# Patient Record
Sex: Male | Born: 1935 | Race: Black or African American | Hispanic: No | Marital: Married | Smoking: Former smoker
Health system: Southern US, Community
[De-identification: ages and names within clinical notes are randomized; demographics above are authoritative.]

## PROBLEM LIST (undated history)

## (undated) DIAGNOSIS — C801 Malignant (primary) neoplasm, unspecified: Secondary | ICD-10-CM

## (undated) DIAGNOSIS — N183 Chronic kidney disease, stage 3 unspecified: Secondary | ICD-10-CM

## (undated) DIAGNOSIS — E785 Hyperlipidemia, unspecified: Secondary | ICD-10-CM

## (undated) DIAGNOSIS — E559 Vitamin D deficiency, unspecified: Secondary | ICD-10-CM

## (undated) DIAGNOSIS — I639 Cerebral infarction, unspecified: Secondary | ICD-10-CM

## (undated) DIAGNOSIS — I10 Essential (primary) hypertension: Secondary | ICD-10-CM

## (undated) DIAGNOSIS — H547 Unspecified visual loss: Secondary | ICD-10-CM

## (undated) HISTORY — DX: Chronic kidney disease, stage 3 (moderate): N18.3

## (undated) HISTORY — DX: Hyperlipidemia, unspecified: E78.5

## (undated) HISTORY — DX: Cerebral infarction, unspecified: I63.9

## (undated) HISTORY — DX: Essential (primary) hypertension: I10

## (undated) HISTORY — DX: Vitamin D deficiency, unspecified: E55.9

## (undated) HISTORY — DX: Chronic kidney disease, stage 3 unspecified: N18.30

## (undated) HISTORY — DX: Malignant (primary) neoplasm, unspecified: C80.1

## (undated) HISTORY — DX: Unspecified visual loss: H54.7

---

## 2007-10-08 ENCOUNTER — Ambulatory Visit (HOSPITAL_COMMUNITY): Admission: RE | Admit: 2007-10-08 | Discharge: 2007-10-08 | Payer: Self-pay | Admitting: Urology

## 2007-10-08 IMAGING — CR DG CHEST 2V
2 series · 2 of 2 positions shown · non-contrast
Comparison: Bone Scan, [DATE]

CLINICAL DATA: Prostate cancer

CHEST - 2 VIEW

[w chest pa]
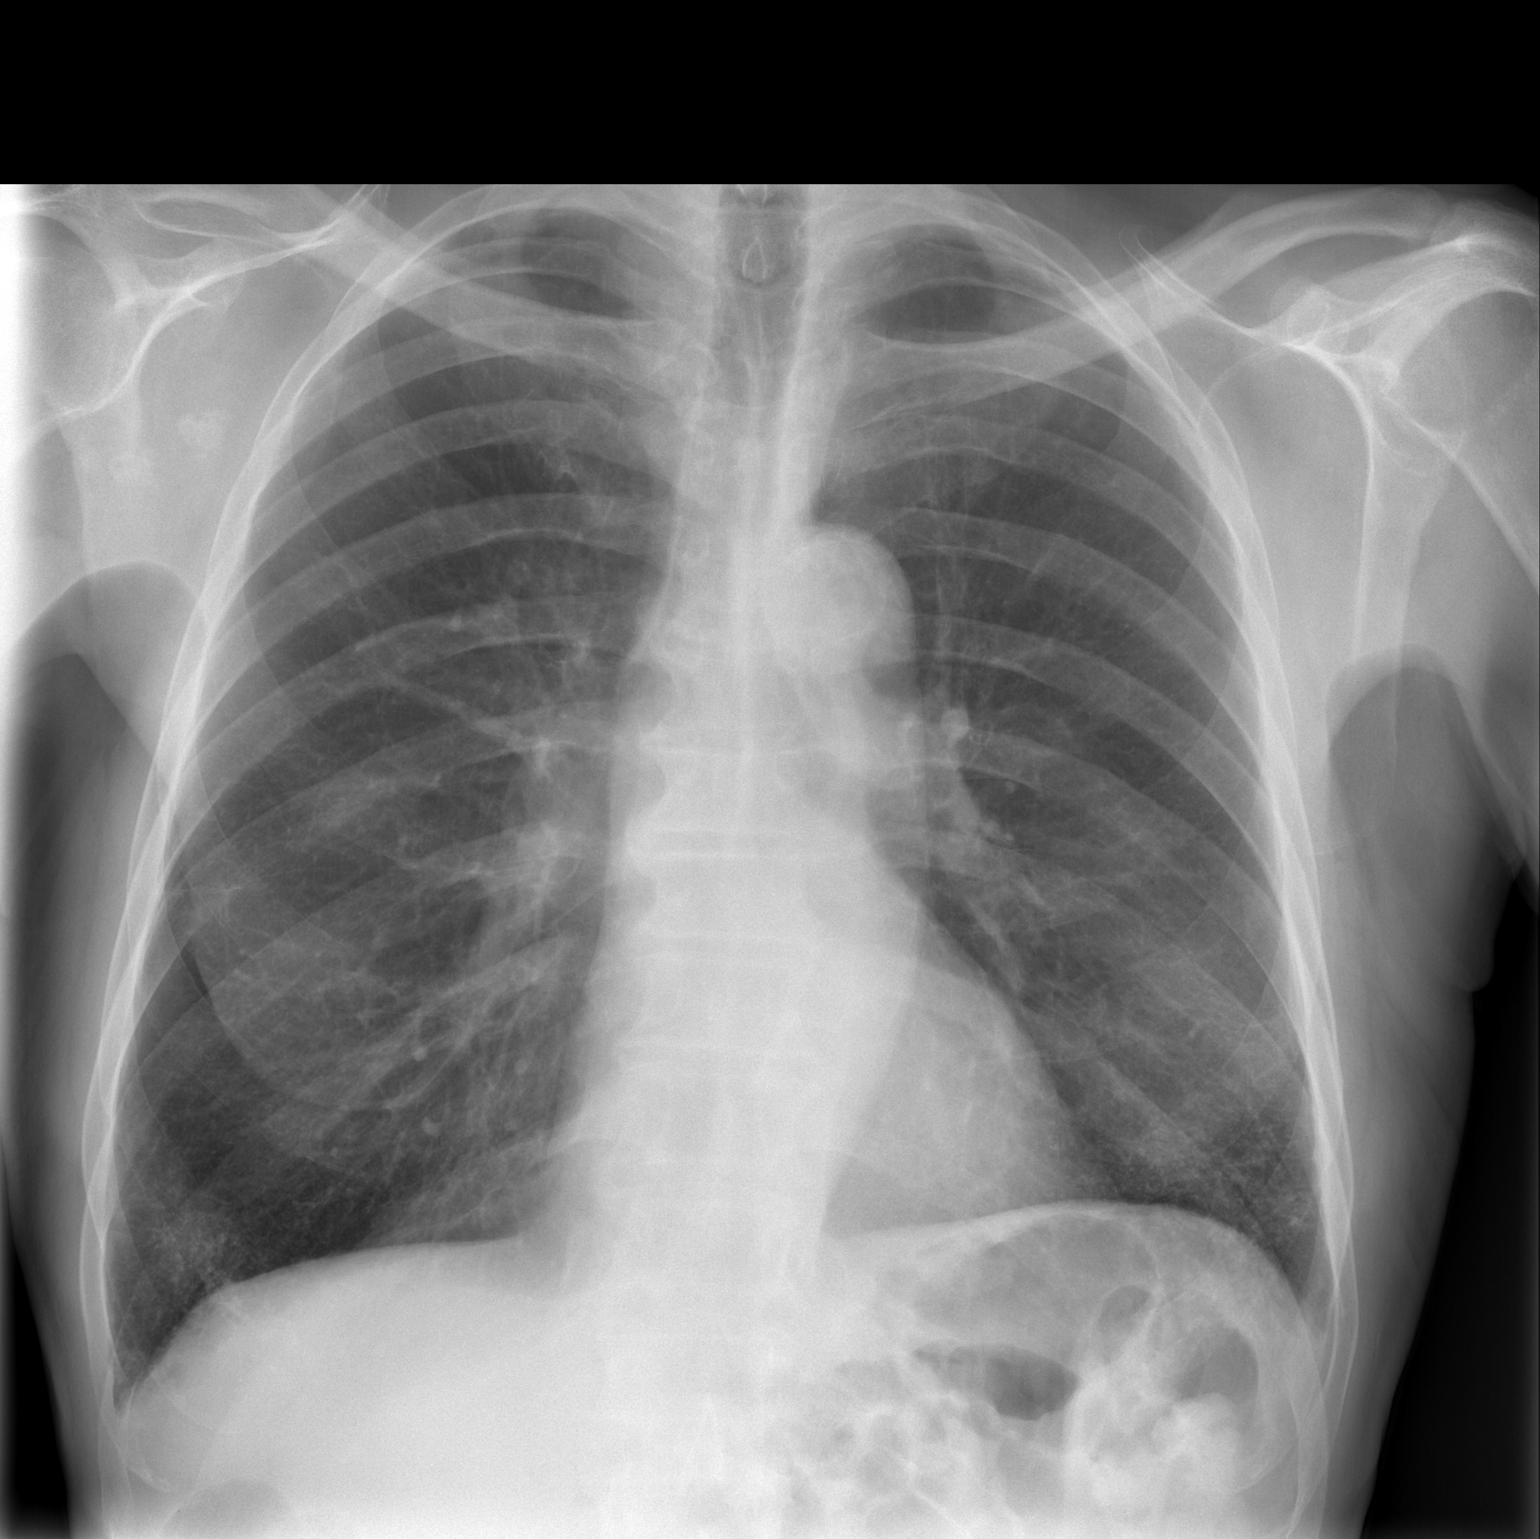

[w chest lat]
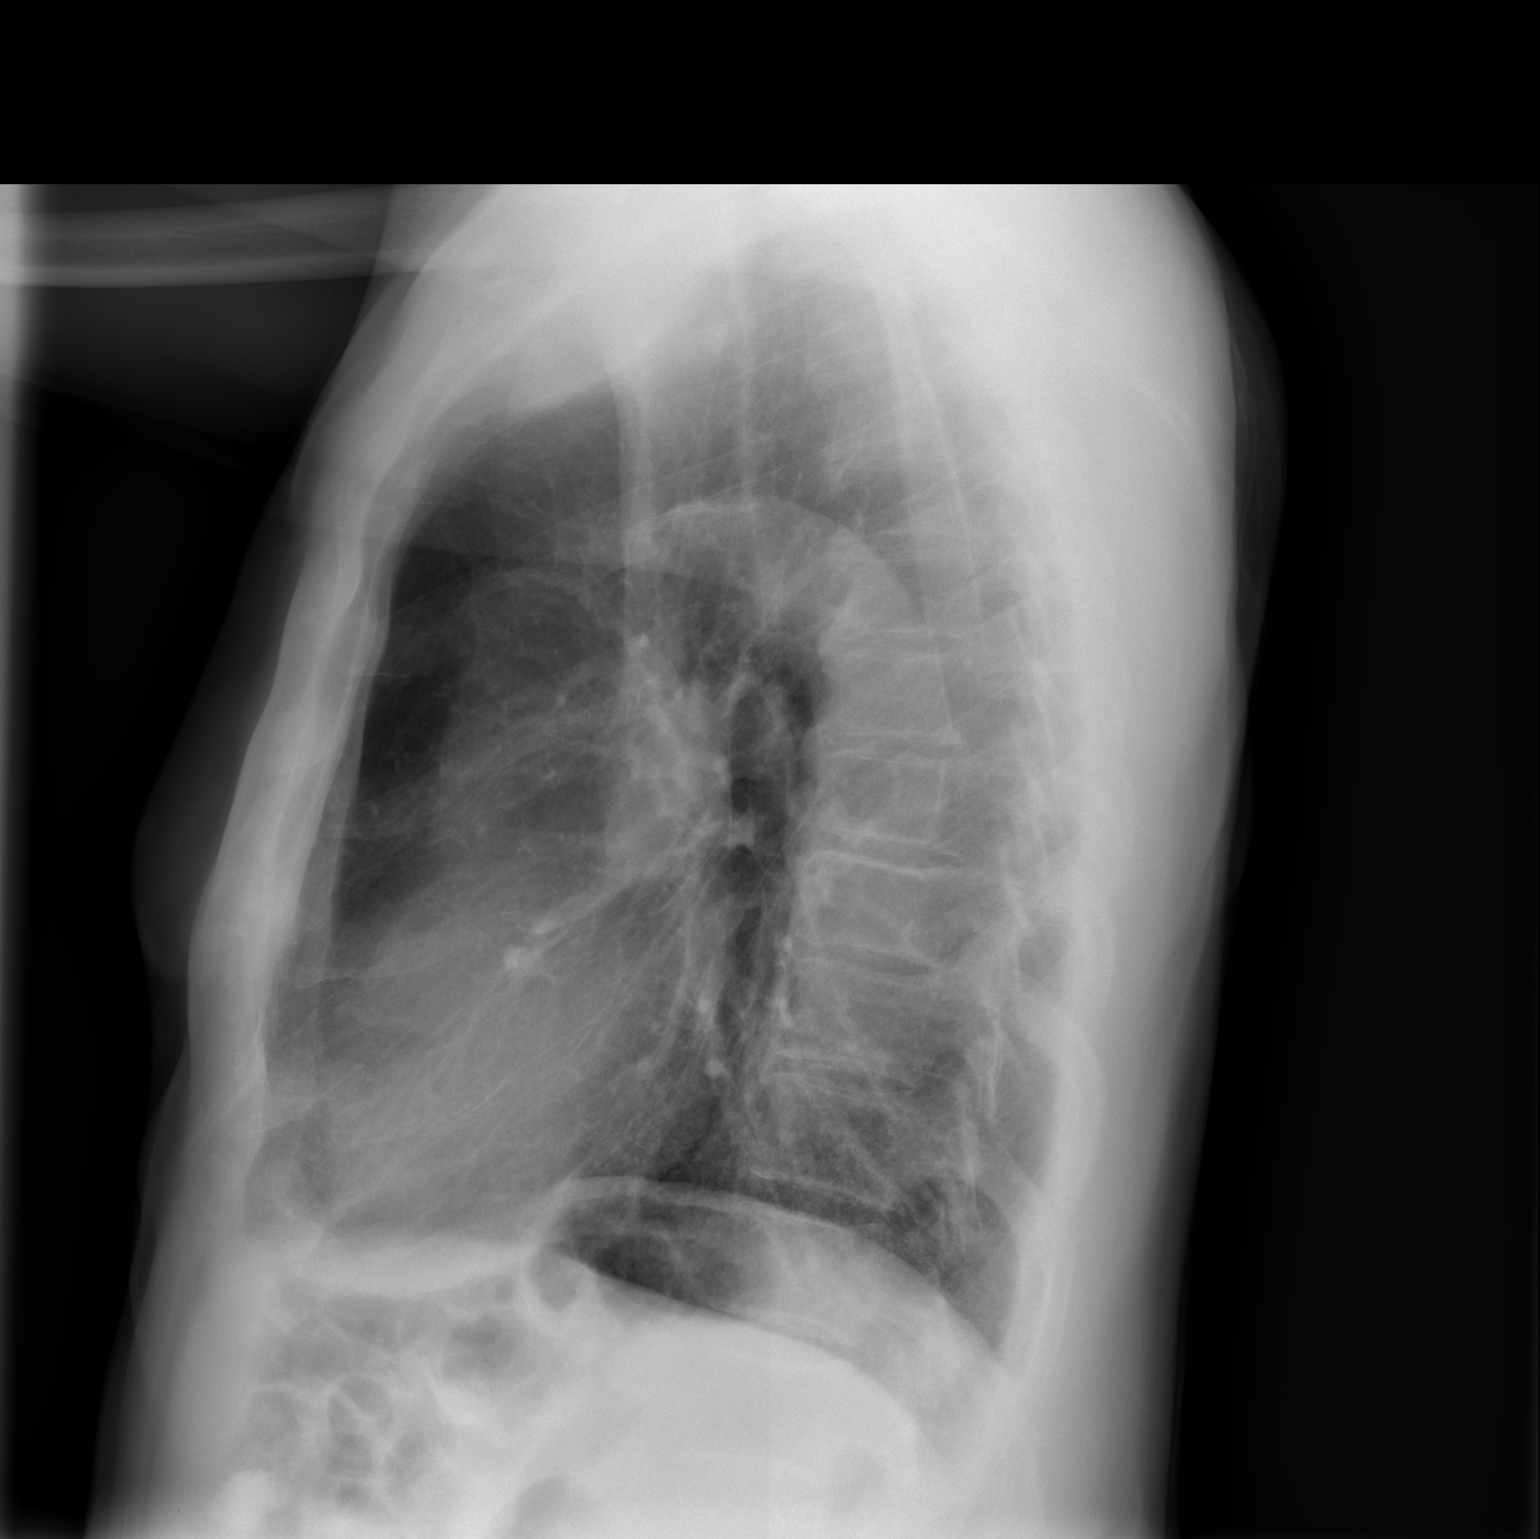

[2 of 2 positions shown; findings below may reference images not displayed]

FINDINGS: Normal mediastinum and cardiac silhouette.  Costophrenic
angles are clear.

No clear lytic or sclerotic lesion identified in the right
posterior fourth through to  correlate with bone scan findings.
There are two foci of sclerosis versus calcific density projecting
over the right scapula.
IMPRESSION: 1..  No and clear evidence of metastatic prostate cancer.
2..  No acute cardiopulmonary process.
3..  Sclerotic foci over the right scapula could represent a
calcified lymph nodes versus calcific tendonitis, versus chondroid
lesion in the scapula.

Recommend CT thorax.

## 2007-11-21 ENCOUNTER — Ambulatory Visit (HOSPITAL_COMMUNITY): Payer: Self-pay | Admitting: Oncology

## 2007-11-21 ENCOUNTER — Encounter (HOSPITAL_COMMUNITY): Admission: RE | Admit: 2007-11-21 | Discharge: 2007-12-21 | Payer: Self-pay | Admitting: Oncology

## 2010-05-22 ENCOUNTER — Encounter: Payer: Self-pay | Admitting: Urology

## 2011-01-27 LAB — DIFFERENTIAL
Eosinophils Absolute: 0.1
Eosinophils Relative: 1
Lymphs Abs: 2
Monocytes Absolute: 0.6
Monocytes Relative: 6
Neutrophils Relative %: 76

## 2011-01-27 LAB — COMPREHENSIVE METABOLIC PANEL
Albumin: 4
BUN: 8
Chloride: 102
GFR calc non Af Amer: >60
Glucose, Bld: 69 — ABNORMAL LOW
Potassium: 4.4
Total Bilirubin: 1.3 — ABNORMAL HIGH
Total Protein: 7.5

## 2011-01-27 LAB — CBC
Platelets: 376
RDW: 13.8

## 2012-06-01 HISTORY — PX: FRACTURE SURGERY: SHX138

## 2012-07-22 ENCOUNTER — Other Ambulatory Visit: Payer: Self-pay

## 2012-07-22 MED ORDER — HYDROCHLOROTHIAZIDE 12.5 MG PO TABS: 12.5000 mg | ORAL_TABLET | Freq: Every day | ORAL | Status: DC

## 2012-10-03 ENCOUNTER — Ambulatory Visit: Payer: Self-pay | Admitting: Family Medicine

## 2012-12-10 ENCOUNTER — Other Ambulatory Visit: Payer: Self-pay | Admitting: Family Medicine

## 2012-12-13 NOTE — Telephone Encounter (Signed)
Last seen 01/31/14n no future appt scheduled

## 2012-12-19 ENCOUNTER — Encounter: Payer: Self-pay | Admitting: Family Medicine

## 2012-12-19 ENCOUNTER — Ambulatory Visit (INDEPENDENT_AMBULATORY_CARE_PROVIDER_SITE_OTHER): Payer: Medicare Other | Admitting: Family Medicine

## 2012-12-19 VITALS — BP 118/64 | HR 84 | Temp 97.5°F | Wt 159.0 lb

## 2012-12-19 DIAGNOSIS — C801 Malignant (primary) neoplasm, unspecified: Secondary | ICD-10-CM | POA: Insufficient documentation

## 2012-12-19 DIAGNOSIS — N400 Enlarged prostate without lower urinary tract symptoms: Secondary | ICD-10-CM

## 2012-12-19 DIAGNOSIS — I10 Essential (primary) hypertension: Secondary | ICD-10-CM | POA: Insufficient documentation

## 2012-12-19 DIAGNOSIS — E785 Hyperlipidemia, unspecified: Secondary | ICD-10-CM | POA: Insufficient documentation

## 2012-12-19 DIAGNOSIS — I639 Cerebral infarction, unspecified: Secondary | ICD-10-CM | POA: Insufficient documentation

## 2012-12-19 DIAGNOSIS — E559 Vitamin D deficiency, unspecified: Secondary | ICD-10-CM

## 2012-12-19 DIAGNOSIS — I6350 Cerebral infarction due to unspecified occlusion or stenosis of unspecified cerebral artery: Secondary | ICD-10-CM

## 2012-12-19 MED ORDER — FINASTERIDE 5 MG PO TABS: 5.0000 mg | ORAL_TABLET | Freq: Every day | ORAL | Status: DC

## 2012-12-19 MED ORDER — PROPRANOLOL HCL 60 MG PO TABS: 60.0000 mg | ORAL_TABLET | Freq: Two times a day (BID) | ORAL | Status: DC

## 2012-12-19 MED ORDER — HYDROCHLOROTHIAZIDE 12.5 MG PO TABS: 12.5000 mg | ORAL_TABLET | Freq: Every day | ORAL | Status: DC

## 2012-12-19 MED ORDER — CLOPIDOGREL BISULFATE 75 MG PO TABS: ORAL_TABLET | ORAL | Status: DC

## 2012-12-19 MED ORDER — AMLODIPINE BESYLATE 10 MG PO TABS: 10.0000 mg | ORAL_TABLET | Freq: Every day | ORAL | Status: DC

## 2012-12-19 MED ORDER — ATORVASTATIN CALCIUM 10 MG PO TABS: 10.0000 mg | ORAL_TABLET | Freq: Every day | ORAL | Status: DC

## 2012-12-19 NOTE — Progress Notes (Signed)
Patient ID: Wesley Lucas, male   DOB: May 29, 1923, 79 y.o.   MRN: [id number] SUBJECTIVE: CC: Chief Complaint  Patient presents with  . Medication Refill    needs refills all meds    HPI: Patient is here for follow up of hyperlipidemia:/HTN denies Headache;denies Chest Pain; No change in weakness, ambulates with a rolling walker. Has not fallen recently.was in rehab at a Nursing home. ;denies Shortness of Breath and orthopnea;denies Visual changes;denies palpitations;denies cough; No changes in pedal edema;denies symptoms of TIA or stroke;deniesClaudication symptoms. admits to Compliance with medications; denies Problems with medications.  Past Medical History  Diagnosis Date  . Vitamin D deficiency   . Cancer     prostate cancer  . Hypertension   . Hyperlipidemia   . Stroke    No past surgical history on file. History   Social History  . Marital Status: Married    Spouse Name: N/A    Number of Children: N/A  . Years of Education: N/A   Occupational History  . Not on file.   Social History Main Topics  . Smoking status: Former Smoker    Types: Cigarettes    Quit date: 05/01/1952  . Smokeless tobacco: Not on file  . Alcohol Use: Not on file  . Drug Use: Not on file  . Sexual Activity: Not on file   Other Topics Concern  . Not on file   Social History Narrative  . No narrative on file   No family history on file. No current outpatient prescriptions on file prior to visit.   No current facility-administered medications on file prior to visit.   No Known Allergies  There is no immunization history on file for this patient. Prior to Admission medications   Medication Sig Start Date End Date Taking? Authorizing Provider  amLODipine (NORVASC) 10 MG tablet Take 1 tablet (10 mg total) by mouth daily. 12/19/12  Yes Ileana Ladd, MD  atorvastatin (LIPITOR) 10 MG tablet Take 1 tablet (10 mg total) by mouth daily. 12/19/12  Yes Ileana Ladd, MD  clopidogrel  (PLAVIX) 75 MG tablet TAKE ONE TABLET BY MOUTH EVERY DAY 12/19/12  Yes Ileana Ladd, MD  finasteride (PROSCAR) 5 MG tablet Take 1 tablet (5 mg total) by mouth daily. 12/19/12  Yes Ileana Ladd, MD  propranolol (INDERAL) 60 MG tablet Take 1 tablet (60 mg total) by mouth 2 (two) times daily. 12/19/12  Yes Ileana Ladd, MD  temazepam (RESTORIL) 15 MG capsule Take 15 mg by mouth at bedtime as needed.  12/12/12  Yes Historical Provider, MD  hydrochlorothiazide (HYDRODIURIL) 12.5 MG tablet Take 1 tablet (12.5 mg total) by mouth daily. 12/19/12   Ileana Ladd, MD  VENTOLIN HFA 108 (90 BASE) MCG/ACT inhaler  11/05/12   Historical Provider, MD     ROS: As above in the HPI. All other systems are stable or negative.  OBJECTIVE: APPEARANCE:  Patient in no acute distress.The patient appeared well nourished and normally developed. Acyanotic. Waist: VITAL SIGNS:BP 118/64  Pulse 84  Temp(Src) 97.5 F (36.4 C) (Oral)  Wt 159 lb (72.122 kg)  Elderly frail AAM  SKIN: warm and  Dry without overt rashes, tattoos and scars  HEAD and Neck: without JVD, Head and scalp: normal Eyes:No scleral icterus.  Ears: Auricle normal, canal normal, Tympanic membranes normal, insufflation normal.decreased hearing Nose: normal Throat: normal Neck & thyroid: normal  CHEST & LUNGS: Chest wall: normal Lungs: Clear  [facility]: Reveals the PMI to be normally  located. Regular rhythm, First and Second Heart sounds are normal,  absence of murmurs, rubs or gallops. Peripheral vasculature: Radial pulses: normal Dorsal pedis pulses: normal Posterior pulses: normal  ABDOMEN:  Appearance: normal Benign, no organomegaly, no masses, no Abdominal Aortic enlargement. No Guarding , no rebound. No Bruits. Bowel sounds: normal  RECTAL: N/A GU: N/A  EXTREMETIES: 1+ dematous.  NEUROLOGIC: oriented to time,place and person; no change in neurologic  Status. Generalized weakness. But no focality today.  Results for orders  placed during the hospital encounter of 11/21/07  CBC      Result Value Range   WBC 11.2 (*)    RBC 5.24     Hemoglobin 14.6     HCT 46.2     MCV 88.3     MCHC 31.7     RDW 13.8     Platelets 376    COMPREHENSIVE METABOLIC PANEL      Result Value Range   Sodium 140     Potassium 4.4     Chloride 102     CO2 30     Glucose, Bld 69 (*)    BUN 8     Creatinine, Ser 1.06     Calcium 9.8     Total Protein 7.5     Albumin 4.0     AST 13     ALT 34     Alkaline Phosphatase 56     Total Bilirubin 1.3 (*)    GFR calc non Af Amer >60     GFR calc Af Amer       Value: >60            The eGFR has been calculated     using the MDRD equation.     This calculation has not been     validated in all clinical  DIFFERENTIAL      Result Value Range   Neutrophils Relative % 76     Neutro Abs 8.5 (*)    Lymphocytes Relative 18     Lymphs Abs 2.0     Monocytes Relative 6     Monocytes Absolute 0.6     Eosinophils Relative 1     Eosinophils Absolute 0.1     Basophils Relative 0     Basophils Absolute 0.0    LEUKOCYTE ALKALINE PHOSPHATASE      Result Value Range   Leukocyte Alkaline  Phos Stain    22 - 124   Value: 69     (NOTE) Performed by [facility], [address]  [address] [phone] [email or web address], Edward R. Ashwood, MD - Lab. Director    ASSESSMENT: Cancer  Hypertension - Plan: hydrochlorothiazide (HYDRODIURIL) 12.5 MG tablet, amLODipine (NORVASC) 10 MG tablet, propranolol (INDERAL) 60 MG tablet, CMP14+EGFR  Stroke - Plan: clopidogrel (PLAVIX) 75 MG tablet  Vitamin D deficiency  Hyperlipidemia - Plan: atorvastatin (LIPITOR) 10 MG tablet, CMP14+EGFR, NMR, lipoprofile  BPH (benign prostatic hyperplasia) - Plan: finasteride (PROSCAR) 5 MG tablet   PLAN:  Orders Placed This Encounter  Procedures  . CMP14+EGFR  . NMR, lipoprofile    Meds ordered this encounter  Medications  . VENTOLIN HFA 108 (90 BASE) MCG/ACT inhaler    Sig:   . DISCONTD:  amLODipine (NORVASC) 10 MG tablet    Sig: Take 10 mg by mouth daily.   Marland Kitchen DISCONTD: atorvastatin (LIPITOR) 10 MG tablet    Sig: Take 10 mg by mouth daily.   Marland Kitchen DISCONTD: SANTYL ointment    Sig:   .  DISCONTD: finasteride (PROSCAR) 5 MG tablet    Sig: Take 5 mg by mouth daily.   Marland Kitchen DISCONTD: propranolol (INDERAL) 60 MG tablet    Sig: Take 60 mg by mouth 2 (two) times daily.   . temazepam (RESTORIL) 15 MG capsule    Sig: Take 15 mg by mouth at bedtime as needed.   . clopidogrel (PLAVIX) 75 MG tablet    Sig: TAKE ONE TABLET BY MOUTH EVERY DAY    Dispense:  30 tablet    Refill:  5    Last refill. Needs to be seen  . hydrochlorothiazide (HYDRODIURIL) 12.5 MG tablet    Sig: Take 1 tablet (12.5 mg total) by mouth daily.    Dispense:  30 tablet    Refill:  5  . amLODipine (NORVASC) 10 MG tablet    Sig: Take 1 tablet (10 mg total) by mouth daily.    Dispense:  30 tablet    Refill:  5  . finasteride (PROSCAR) 5 MG tablet    Sig: Take 1 tablet (5 mg total) by mouth daily.    Dispense:  30 tablet    Refill:  5  . atorvastatin (LIPITOR) 10 MG tablet    Sig: Take 1 tablet (10 mg total) by mouth daily.    Dispense:  30 tablet    Refill:  5  . propranolol (INDERAL) 60 MG tablet    Sig: Take 1 tablet (60 mg total) by mouth 2 (two) times daily.    Dispense:  60 tablet    Refill:  5   No changes.   Return in about 3 months (around 03/21/2013) for Recheck medical problems.  Franklin Clapsaddle P. Modesto Charon, M.D.

## 2012-12-20 LAB — CMP14+EGFR
ALT: 25 IU/L (ref 0–44)
AST: 23 IU/L (ref 0–40)
Albumin/Globulin Ratio: 1.1 (ref 1.1–2.5)
Albumin: 4.1 g/dL (ref 3.5–4.7)
Alkaline Phosphatase: 117 IU/L (ref 39–117)
BUN/Creatinine Ratio: 14 (ref 10–22)
BUN: 20 mg/dL (ref 8–27)
CO2: 26 mmol/L (ref 18–29)
Calcium: 10.1 mg/dL (ref 8.6–10.2)
Chloride: 101 mmol/L (ref 97–108)
Creatinine, Ser: 1.4 mg/dL — ABNORMAL HIGH (ref 0.76–1.27)
GFR calc Af Amer: 52 mL/min/{1.73_m2} — ABNORMAL LOW (ref >59)
GFR calc non Af Amer: 45 mL/min/{1.73_m2} — ABNORMAL LOW (ref >59)
Globulin, Total: 3.8 g/dL (ref 1.5–4.5)
Glucose: 101 mg/dL — ABNORMAL HIGH (ref 65–99)
Potassium: 4.7 mmol/L (ref 3.5–5.2)
Sodium: 142 mmol/L (ref 134–144)
Total Bilirubin: 0.4 mg/dL (ref 0.0–1.2)
Total Protein: 7.9 g/dL (ref 6.0–8.5)

## 2012-12-20 LAB — NMR, LIPOPROFILE
Cholesterol: 109 mg/dL (ref <200)
HDL Cholesterol by NMR: 43 mg/dL (ref >=40)
HDL Particle Number: 28.4 umol/L — ABNORMAL LOW (ref >=30.5)
LDL Particle Number: 706 nmol/L (ref <1000)
LDL Size: 21 nm (ref >20.5)
LDLC SERPL CALC-MCNC: 47 mg/dL (ref <100)
LP-IR Score: <25 (ref <=45)
Small LDL Particle Number: <90 nmol/L (ref <=527)
Triglycerides by NMR: 96 mg/dL (ref <150)

## 2012-12-20 NOTE — Progress Notes (Signed)
Quick Note:  Labs abnormal. The creatinine is a little higher. Would like him to RTC in 2 months to recheck the kidney functions. The rest was stable or good. ______

## 2013-01-13 ENCOUNTER — Other Ambulatory Visit: Payer: Self-pay | Admitting: Family Medicine

## 2013-01-14 ENCOUNTER — Encounter: Payer: Self-pay | Admitting: *Deleted

## 2013-01-14 NOTE — Telephone Encounter (Signed)
Last seen 12/19/12, last filled 12/12/12, if approved route to pool A so [facility]

## 2013-01-16 ENCOUNTER — Other Ambulatory Visit: Payer: Self-pay

## 2013-01-16 DIAGNOSIS — E785 Hyperlipidemia, unspecified: Secondary | ICD-10-CM

## 2013-01-16 DIAGNOSIS — I639 Cerebral infarction, unspecified: Secondary | ICD-10-CM

## 2013-01-16 DIAGNOSIS — I10 Essential (primary) hypertension: Secondary | ICD-10-CM

## 2013-01-16 DIAGNOSIS — N400 Enlarged prostate without lower urinary tract symptoms: Secondary | ICD-10-CM

## 2013-01-16 MED ORDER — HYDROCHLOROTHIAZIDE 12.5 MG PO TABS: 12.5000 mg | ORAL_TABLET | Freq: Every day | ORAL | Status: DC

## 2013-01-16 MED ORDER — ATORVASTATIN CALCIUM 10 MG PO TABS: 10.0000 mg | ORAL_TABLET | Freq: Every day | ORAL | Status: DC

## 2013-01-16 MED ORDER — AMLODIPINE BESYLATE 10 MG PO TABS: 10.0000 mg | ORAL_TABLET | Freq: Every day | ORAL | Status: DC

## 2013-01-16 MED ORDER — CLOPIDOGREL BISULFATE 75 MG PO TABS: ORAL_TABLET | ORAL | Status: DC

## 2013-01-16 MED ORDER — FINASTERIDE 5 MG PO TABS: 5.0000 mg | ORAL_TABLET | Freq: Every day | ORAL | Status: DC

## 2013-01-16 MED ORDER — PROPRANOLOL HCL 60 MG PO TABS: 60.0000 mg | ORAL_TABLET | Freq: Two times a day (BID) | ORAL | Status: DC

## 2013-01-16 NOTE — Telephone Encounter (Signed)
Rx ready for Phone in. 

## 2013-01-16 NOTE — Telephone Encounter (Signed)
Temazepam rx called to [facility] and left on voice mail

## 2013-02-17 ENCOUNTER — Other Ambulatory Visit: Payer: Self-pay | Admitting: Family Medicine

## 2013-02-18 NOTE — Telephone Encounter (Signed)
Last seen 12/19/12  FPW

## 2013-02-18 NOTE — Telephone Encounter (Signed)
rx ready for pickup 

## 2013-02-20 ENCOUNTER — Other Ambulatory Visit: Payer: Self-pay | Admitting: Family Medicine

## 2013-02-20 NOTE — Telephone Encounter (Signed)
[facility] 

## 2013-03-03 ENCOUNTER — Encounter: Payer: Self-pay | Admitting: *Deleted

## 2013-03-21 ENCOUNTER — Ambulatory Visit: Payer: Medicare Other | Admitting: Family Medicine

## 2013-03-26 ENCOUNTER — Ambulatory Visit: Payer: Medicare Other | Admitting: Family Medicine

## 2013-03-28 ENCOUNTER — Ambulatory Visit: Payer: Medicare Other | Admitting: Family Medicine

## 2013-04-07 ENCOUNTER — Ambulatory Visit (INDEPENDENT_AMBULATORY_CARE_PROVIDER_SITE_OTHER): Payer: Medicare Other | Admitting: Family Medicine

## 2013-04-07 ENCOUNTER — Encounter: Payer: Self-pay | Admitting: Family Medicine

## 2013-04-07 VITALS — BP 136/69 | HR 78 | Temp 97.9°F | Ht 71.0 in | Wt 163.6 lb

## 2013-04-07 DIAGNOSIS — R7309 Other abnormal glucose: Secondary | ICD-10-CM

## 2013-04-07 DIAGNOSIS — I10 Essential (primary) hypertension: Secondary | ICD-10-CM

## 2013-04-07 DIAGNOSIS — R739 Hyperglycemia, unspecified: Secondary | ICD-10-CM

## 2013-04-07 DIAGNOSIS — I6350 Cerebral infarction due to unspecified occlusion or stenosis of unspecified cerebral artery: Secondary | ICD-10-CM

## 2013-04-07 DIAGNOSIS — E785 Hyperlipidemia, unspecified: Secondary | ICD-10-CM

## 2013-04-07 DIAGNOSIS — I639 Cerebral infarction, unspecified: Secondary | ICD-10-CM

## 2013-04-07 DIAGNOSIS — C801 Malignant (primary) neoplasm, unspecified: Secondary | ICD-10-CM

## 2013-04-07 DIAGNOSIS — H9190 Unspecified hearing loss, unspecified ear: Secondary | ICD-10-CM | POA: Insufficient documentation

## 2013-04-07 DIAGNOSIS — E559 Vitamin D deficiency, unspecified: Secondary | ICD-10-CM

## 2013-04-07 DIAGNOSIS — H9193 Unspecified hearing loss, bilateral: Secondary | ICD-10-CM

## 2013-04-07 NOTE — Progress Notes (Signed)
Patient ID: Wesley Lucas, male   DOB: 1923-08-29, 79 y.o.   MRN: [id number] SUBJECTIVE: CC: Chief Complaint  Patient presents with  . Follow-up    3 month follow up     HPI:  Patient is here for follow up of hyperlipidemia: /HTN/Vit D Def./CVA.  denies Headache;denies Chest Pain;denies weakness;denies Shortness of Breath and orthopnea;denies Visual changes;denies palpitations;denies cough;denies pedal edema;denies symptoms of TIA or stroke;deniesClaudication symptoms. admits to Compliance with medications; denies Problems with medications.  H/o prostate cancer: stable  Overall patient thinks he is fine and doing well.  Past Medical History  Diagnosis Date  . Vitamin D deficiency   . Cancer     prostate cancer  . Hypertension   . Hyperlipidemia   . Stroke    Past Surgical History  Procedure Laterality Date  . Fracture surgery Left 06/2012    left hip fracture   History   Social History  . Marital Status: Married    Spouse Name: N/A    Number of Children: N/A  . Years of Education: N/A   Occupational History  . Not on file.   Social History Main Topics  . Smoking status: Former Smoker    Types: Cigarettes    Quit date: 05/01/1952  . Smokeless tobacco: Not on file  . Alcohol Use: Not on file  . Drug Use: Not on file  . Sexual Activity: Not on file   Other Topics Concern  . Not on file   Social History Narrative  . No narrative on file   No family history on file. Current Outpatient Prescriptions on File Prior to Visit  Medication Sig Dispense Refill  . amLODipine (NORVASC) 10 MG tablet Take 1 tablet (10 mg total) by mouth daily.  90 tablet  1  . atorvastatin (LIPITOR) 10 MG tablet Take 1 tablet (10 mg total) by mouth daily.  90 tablet  1  . clopidogrel (PLAVIX) 75 MG tablet TAKE ONE TABLET BY MOUTH EVERY DAY  90 tablet  1  . finasteride (PROSCAR) 5 MG tablet Take 1 tablet (5 mg total) by mouth daily.  90 tablet  1  . hydrochlorothiazide (HYDRODIURIL)  12.5 MG tablet Take 1 tablet (12.5 mg total) by mouth daily.  90 tablet  1  . propranolol (INDERAL) 60 MG tablet Take 1 tablet (60 mg total) by mouth 2 (two) times daily.  180 tablet  1  . temazepam (RESTORIL) 15 MG capsule TAKE 1 CAPSULE BY MOUTH AT BEDTIME  30 capsule  3  . VENTOLIN HFA 108 (90 BASE) MCG/ACT inhaler        No current facility-administered medications on file prior to visit.   No Known Allergies  There is no immunization history on file for this patient. Prior to Admission medications   Medication Sig Start Date End Date Taking? Authorizing Provider  amLODipine (NORVASC) 10 MG tablet Take 1 tablet (10 mg total) by mouth daily. 01/16/13  Yes Ernestina Penna, MD  atorvastatin (LIPITOR) 10 MG tablet Take 1 tablet (10 mg total) by mouth daily. 01/16/13  Yes Ernestina Penna, MD  clopidogrel (PLAVIX) 75 MG tablet TAKE ONE TABLET BY MOUTH EVERY DAY 01/16/13  Yes Ernestina Penna, MD  finasteride (PROSCAR) 5 MG tablet Take 1 tablet (5 mg total) by mouth daily. 01/16/13  Yes Ernestina Penna, MD  hydrochlorothiazide (HYDRODIURIL) 12.5 MG tablet Take 1 tablet (12.5 mg total) by mouth daily. 01/16/13  Yes Ernestina Penna, MD  propranolol (INDERAL) 60 MG  tablet Take 1 tablet (60 mg total) by mouth 2 (two) times daily. 01/16/13  Yes Ernestina Penna, MD  temazepam (RESTORIL) 15 MG capsule TAKE 1 CAPSULE BY MOUTH AT BEDTIME 02/17/13  Yes Mary-Margaret Daphine Deutscher, FNP  VENTOLIN HFA 108 (90 BASE) MCG/ACT inhaler  11/05/12  Yes Historical Provider, MD     ROS: As above in the HPI. All other systems are stable or negative.  OBJECTIVE: APPEARANCE:  Patient in no acute distress.The patient appeared well nourished and normally developed. Acyanotic. Waist: VITAL SIGNS:BP 136/69  Pulse 78  Temp(Src) 97.9 F (36.6 C) (Oral)  Ht 5\' 11"  (1.803 m)  Wt 163 lb 9.6 oz (74.208 kg)  BMI 22.83 kg/m2 Elderly frail AAM. Poor hearing( not new) Ambulates with a  walker  SKIN: warm and  Dry without overt rashes,  tattoos and scars  HEAD and Neck: without JVD, Head and scalp: normal Eyes:No scleral icterus. Fundi normal, eye movements normal. Ears: Auricle normal, canal normal, Tympanic membranes normal, insufflation normal. Nose: normal Throat: normal Neck & thyroid: normal  CHEST & LUNGS: Chest wall: normal Lungs: Clear  CVS: Reveals the PMI to be normally located. Regular rhythm, First and Second Heart sounds are normal,  absence of murmurs, rubs or gallops. Peripheral vasculature: Radial pulses: normal Dorsal pedis pulses: normal Posterior pulses: normal  ABDOMEN:  Appearance: normal Benign, no organomegaly, no masses, no Abdominal Aortic enlargement. No Guarding , no rebound. No Bruits. Bowel sounds: normal  RECTAL: N/A GU: N/A  EXTREMETIES: nonedematous.  NEUROLOGIC: oriented to time,place and person; nonfocal. Generalized weakness  Results for orders placed in visit on 12/19/12  CMP14+EGFR      Result Value Range   Glucose 101 (*) 65 - 99 mg/dL   BUN 20  8 - 27 mg/dL   Creatinine, Ser 1.61 (*) 0.76 - 1.27 mg/dL   GFR calc non Af Amer 45 (*) >59 mL/min/1.73   GFR calc Af Amer 52 (*) >59 mL/min/1.73   BUN/Creatinine Ratio 14  10 - 22   Sodium 142  134 - 144 mmol/L   Potassium 4.7  3.5 - 5.2 mmol/L   Chloride 101  97 - 108 mmol/L   CO2 26  18 - 29 mmol/L   Calcium 10.1  8.6 - 10.2 mg/dL   Total Protein 7.9  6.0 - 8.5 g/dL   Albumin 4.1  3.5 - 4.7 g/dL   Globulin, Total 3.8  1.5 - 4.5 g/dL   Albumin/Globulin Ratio 1.1  1.1 - 2.5   Total Bilirubin 0.4  0.0 - 1.2 mg/dL   Alkaline Phosphatase 117  39 - 117 IU/L   AST 23  0 - 40 IU/L   ALT 25  0 - 44 IU/L  NMR, LIPOPROFILE      Result Value Range   LDL Particle Number 706  <1000 nmol/L   LDLC SERPL CALC-MCNC 47  <100 mg/dL   HDL Cholesterol by NMR 43  >=40 mg/dL   Triglycerides by NMR 96  <150 mg/dL   Cholesterol 096  <045 mg/dL   HDL Particle Number 40.9 (*) >=30.5 umol/L   Small LDL Particle Number < 90  <=  527 nmol/L   LDL Size 21.0  >20.5 nm   LP-IR Score < 25  <= 45    ASSESSMENT:   Hypertension - Plan: CMP14+EGFR  Hyperlipidemia - Plan: CMP14+EGFR, NMR, lipoprofile  Vitamin D deficiency  Stroke  Cancer - prostate  Hard of hearing, bilateral Stable. Not much else to offer at  this point  PLAN: Same medications.   Orders Placed This Encounter  Procedures  . CMP14+EGFR  . NMR, lipoprofile   No orders of the defined types were placed in this encounter.   There are no discontinued medications. Return in about 4 months (around 08/06/2013) for Recheck medical problems.  Lenny Fiumara P. [place] Charon, M.D.

## 2013-04-09 LAB — CMP14+EGFR
ALT: 16 IU/L (ref 0–44)
AST: 15 IU/L (ref 0–40)
Albumin/Globulin Ratio: 1.5 (ref 1.1–2.5)
Albumin: 4.1 g/dL (ref 3.5–4.7)
Alkaline Phosphatase: 144 IU/L — ABNORMAL HIGH (ref 39–117)
BUN/Creatinine Ratio: 17 (ref 10–22)
BUN: 26 mg/dL (ref 8–27)
CO2: 26 mmol/L (ref 18–29)
Calcium: 10.3 mg/dL — ABNORMAL HIGH (ref 8.6–10.2)
Chloride: 104 mmol/L (ref 97–108)
Creatinine, Ser: 1.57 mg/dL — ABNORMAL HIGH (ref 0.76–1.27)
GFR calc Af Amer: 45 mL/min/{1.73_m2} — ABNORMAL LOW (ref >59)
GFR calc non Af Amer: 39 mL/min/{1.73_m2} — ABNORMAL LOW (ref >59)
Globulin, Total: 2.8 g/dL (ref 1.5–4.5)
Glucose: 162 mg/dL — ABNORMAL HIGH (ref 65–99)
Potassium: 4.7 mmol/L (ref 3.5–5.2)
Sodium: 146 mmol/L — ABNORMAL HIGH (ref 134–144)
Total Bilirubin: 0.7 mg/dL (ref 0.0–1.2)
Total Protein: 6.9 g/dL (ref 6.0–8.5)

## 2013-04-09 LAB — NMR, LIPOPROFILE
Cholesterol: 108 mg/dL (ref <200)
HDL Cholesterol by NMR: 47 mg/dL (ref >=40)
HDL Particle Number: 30 umol/L — ABNORMAL LOW (ref >=30.5)
LDL Particle Number: 628 nmol/L (ref <1000)
LDL Size: 20.5 nm — ABNORMAL LOW (ref >20.5)
LDLC SERPL CALC-MCNC: 49 mg/dL (ref <100)
LP-IR Score: <25 (ref <=45)
Small LDL Particle Number: 409 nmol/L (ref <=527)
Triglycerides by NMR: 61 mg/dL (ref <150)

## 2013-04-09 NOTE — Progress Notes (Signed)
Quick Note:  Call Patient Labs that are abnormal: Sugar is high Creatinine is a little higher. Will watch this closely.  The rest are stable or at goal  Recommendations: Additional lab ordered . The HGBA1C. await that result. Otherwise no changes for now.   ______

## 2013-04-09 NOTE — Addendum Note (Signed)
Addended by: Ileana Ladd on: 04/09/2013 02:09 PM   Modules accepted: Orders

## 2013-04-21 ENCOUNTER — Telehealth: Payer: Self-pay | Admitting: Family Medicine

## 2013-04-22 NOTE — Telephone Encounter (Signed)
Pt notified of labs Verbalizes understanding 

## 2013-05-19 ENCOUNTER — Encounter: Payer: Self-pay | Admitting: *Deleted

## 2013-06-08 ENCOUNTER — Other Ambulatory Visit: Payer: Self-pay | Admitting: Family Medicine

## 2013-06-16 ENCOUNTER — Other Ambulatory Visit: Payer: Self-pay | Admitting: Nurse Practitioner

## 2013-06-17 NOTE — Telephone Encounter (Signed)
Last seen 04/07/13, last filled 05/20/13.

## 2013-06-18 NOTE — Telephone Encounter (Signed)
Called in.

## 2013-06-18 NOTE — Telephone Encounter (Signed)
Rx ready for nurse to Phone in. 

## 2013-06-30 ENCOUNTER — Other Ambulatory Visit: Payer: Self-pay | Admitting: Family Medicine

## 2013-07-03 ENCOUNTER — Other Ambulatory Visit: Payer: Self-pay | Admitting: Family Medicine

## 2013-07-06 ENCOUNTER — Other Ambulatory Visit: Payer: Self-pay | Admitting: Family Medicine

## 2013-07-16 ENCOUNTER — Other Ambulatory Visit: Payer: Self-pay | Admitting: Family Medicine

## 2013-08-07 ENCOUNTER — Ambulatory Visit: Payer: Medicare Other | Admitting: Family Medicine

## 2013-08-08 ENCOUNTER — Ambulatory Visit (INDEPENDENT_AMBULATORY_CARE_PROVIDER_SITE_OTHER): Payer: Medicare Other | Admitting: Family Medicine

## 2013-08-08 ENCOUNTER — Encounter: Payer: Self-pay | Admitting: Family Medicine

## 2013-08-08 VITALS — BP 123/64 | HR 75 | Temp 98.2°F | Ht 71.0 in | Wt 167.0 lb

## 2013-08-08 DIAGNOSIS — I10 Essential (primary) hypertension: Secondary | ICD-10-CM

## 2013-08-08 DIAGNOSIS — I6350 Cerebral infarction due to unspecified occlusion or stenosis of unspecified cerebral artery: Secondary | ICD-10-CM

## 2013-08-08 DIAGNOSIS — E559 Vitamin D deficiency, unspecified: Secondary | ICD-10-CM

## 2013-08-08 DIAGNOSIS — N183 Chronic kidney disease, stage 3 unspecified: Secondary | ICD-10-CM | POA: Insufficient documentation

## 2013-08-08 DIAGNOSIS — R739 Hyperglycemia, unspecified: Secondary | ICD-10-CM

## 2013-08-08 DIAGNOSIS — H547 Unspecified visual loss: Secondary | ICD-10-CM

## 2013-08-08 DIAGNOSIS — R7309 Other abnormal glucose: Secondary | ICD-10-CM

## 2013-08-08 DIAGNOSIS — H9190 Unspecified hearing loss, unspecified ear: Secondary | ICD-10-CM

## 2013-08-08 DIAGNOSIS — C801 Malignant (primary) neoplasm, unspecified: Secondary | ICD-10-CM

## 2013-08-08 DIAGNOSIS — E785 Hyperlipidemia, unspecified: Secondary | ICD-10-CM

## 2013-08-08 DIAGNOSIS — I639 Cerebral infarction, unspecified: Secondary | ICD-10-CM

## 2013-08-08 LAB — POCT GLYCOSYLATED HEMOGLOBIN (HGB A1C): Hemoglobin A1C: 4.9

## 2013-08-08 NOTE — Progress Notes (Signed)
Patient ID: Wesley Lucas, male   DOB: 1924-03-12, 80 y.o.   MRN: [id number] SUBJECTIVE: CC: Chief Complaint  Patient presents with  . Follow-up    4 month follow up . denies any falls . alert and oriented date time place    HPI: Here for followup. Has poor vision. Has not seen his eye doctor in a while Hard of hearing: doesn't want to use hearing aids. Came to check on his labwork and medicines. Hasn't had any falls in the last year. Denies depression Symptoms on screen. Patient has prostate cancer, not a problem at this time  Patient is here for follow up of hyperlipidemia/HTN/CKD 3B/Vit D Def/Stroke history denies Headache;denies Chest Pain;denies weakness;denies Shortness of Breath and orthopnea;denies Visual changes;denies palpitations;denies cough;denies pedal edema;denies symptoms of TIA or stroke;deniesClaudication symptoms. admits to Compliance with medications; denies Problems with medications.    Past Medical History  Diagnosis Date  . Vitamin D deficiency   . Hypertension   . Hyperlipidemia   . Stroke   . Poor vision   . CKD (chronic kidney disease), stage III   . Cancer     prostate cancer   Past Surgical History  Procedure Laterality Date  . Fracture surgery Left 06/2012    left hip fracture   History   Social History  . Marital Status: Married    Spouse Name: N/A    Number of Children: N/A  . Years of Education: N/A   Occupational History  . Not on file.   Social History Main Topics  . Smoking status: Former Smoker    Types: Cigarettes    Quit date: 05/01/1952  . Smokeless tobacco: Not on file  . Alcohol Use: Not on file  . Drug Use: Not on file  . Sexual Activity: Not on file   Other Topics Concern  . Not on file   Social History Narrative  . No narrative on file   No family history on file. Current Outpatient Prescriptions on File Prior to Visit  Medication Sig Dispense Refill  . amLODipine (NORVASC) 10 MG tablet TAKE 1 TABLET (10  MG TOTAL) BY MOUTH DAILY.  30 tablet  2  . atorvastatin (LIPITOR) 10 MG tablet TAKE 1 TABLET (10 MG TOTAL) BY MOUTH DAILY.  30 tablet  3  . clopidogrel (PLAVIX) 75 MG tablet TAKE ONE TABLET BY MOUTH EVERY DAY NEEDS TO BE SEEN  30 tablet  2  . finasteride (PROSCAR) 5 MG tablet TAKE 1 TABLET (5 MG TOTAL) BY MOUTH DAILY.  90 tablet  0  . hydrochlorothiazide (HYDRODIURIL) 12.5 MG tablet Take 1 tablet (12.5 mg total) by mouth daily.  90 tablet  1  . propranolol (INDERAL) 60 MG tablet TAKE 1 TABLET (60 MG TOTAL) BY MOUTH 2 (TWO) TIMES DAILY.  60 tablet  2  . temazepam (RESTORIL) 15 MG capsule TAKE 1 CAPSULE AT BEDTIME  30 capsule  3  . VENTOLIN HFA 108 (90 BASE) MCG/ACT inhaler        No current facility-administered medications on file prior to visit.   No Known Allergies  There is no immunization history on file for this patient. Prior to Admission medications   Medication Sig Start Date End Date Taking? Authorizing Provider  amLODipine (NORVASC) 10 MG tablet Take 1 tablet (10 mg total) by mouth daily. 01/16/13   Chipper Herb, MD  amLODipine (NORVASC) 10 MG tablet TAKE 1 TABLET (10 MG TOTAL) BY MOUTH DAILY.    Vernie Shanks, MD  atorvastatin (LIPITOR) 10 MG tablet Take 1 tablet (10 mg total) by mouth daily. 01/16/13   Chipper Herb, MD  atorvastatin (LIPITOR) 10 MG tablet TAKE 1 TABLET (10 MG TOTAL) BY MOUTH DAILY. 06/08/13   Vernie Shanks, MD  clopidogrel (PLAVIX) 75 MG tablet TAKE ONE TABLET BY MOUTH EVERY DAY 01/16/13   Chipper Herb, MD  clopidogrel (PLAVIX) 75 MG tablet TAKE ONE TABLET BY MOUTH EVERY DAY NEEDS TO BE SEEN    Vernie Shanks, MD  finasteride (PROSCAR) 5 MG tablet TAKE 1 TABLET (5 MG TOTAL) BY MOUTH DAILY.    Chipper Herb, MD  hydrochlorothiazide (HYDRODIURIL) 12.5 MG tablet Take 1 tablet (12.5 mg total) by mouth daily. 01/16/13   Chipper Herb, MD  propranolol (INDERAL) 60 MG tablet Take 1 tablet (60 mg total) by mouth 2 (two) times daily. 01/16/13   Chipper Herb, MD   propranolol (INDERAL) 60 MG tablet TAKE 1 TABLET (60 MG TOTAL) BY MOUTH 2 (TWO) TIMES DAILY.    Vernie Shanks, MD  temazepam (RESTORIL) 15 MG capsule TAKE 1 CAPSULE AT BEDTIME    Vernie Shanks, MD  VENTOLIN HFA 108 (90 BASE) MCG/ACT inhaler  11/05/12   Historical Provider, MD     ROS: As above in the HPI. All other systems are stable or negative.  OBJECTIVE: APPEARANCE:  Patient in no acute distress.The patient appeared well nourished and normally developed. Acyanotic. Waist: VITAL SIGNS:BP 123/64  Pulse 75  Temp(Src) 98.2 F (36.8 C) (Oral)  Ht 5' 11"  (1.803 m)  Wt 167 lb (75.751 kg)  BMI 23.30 kg/m2  Elderly AAM uses a walker to ambulate quite well.   SKIN: warm and  Dry without overt rashes, tattoos and scars  HEAD and Neck: without JVD, Head and scalp: normal Eyes:No scleral icterus.eye movements asymmetric and poor vision Ears: Auricle normal, canal normal, Tympanic membranes normal, insufflation normal. Nose: normal Throat: many missing teeth Neck & thyroid: normal  CHEST & LUNGS: Chest wall: normal Lungs: Clear  CVS: Reveals the PMI to be normally located. Regular rhythm, First and Second Heart sounds are normal,  absence of murmurs, rubs or gallops. Peripheral vasculature: Radial pulses: normal Dorsal pedis pulses: normal Posterior pulses: normal  ABDOMEN:  Appearance: normal Benign, no organomegaly, no masses, no Abdominal Aortic enlargement. No Guarding , no rebound. No Bruits. Bowel sounds: normal  RECTAL: N/A GU: N/A  EXTREMETIES: nonedematous.  MUSCULOSKELETAL:  Spine: reduced ROM Joints: intact  NEUROLOGIC: oriented to time,place and person; nonfocal. Generalized weakness.  Results for orders placed in visit on 08/08/13  POCT GLYCOSYLATED HEMOGLOBIN (HGB A1C)      Result Value Ref Range   Hemoglobin A1C 4.9      ASSESSMENT: Stroke  Hypertension - Plan: CMP14+EGFR  Hyperlipidemia - Plan: NMR, lipoprofile  Cancer  Vitamin  D deficiency - Plan: Vit D  25 hydroxy (rtn osteoporosis monitoring)  Hard of hearing  Poor vision  Hyperglycemia - Plan: POCT glycosylated hemoglobin (Hb A1C)  PLAN: Patient to have family take him to have eye exam and follow up Consider hearing aids. Fall prevention handout for family to read. Continue with same medications. Await labs.  Orders Placed This Encounter  Procedures  . CMP14+EGFR  . NMR, lipoprofile  . Vit D  25 hydroxy (rtn osteoporosis monitoring)  . POCT glycosylated hemoglobin (Hb A1C)   No orders of the defined types were placed in this encounter.   Medications Discontinued During This Encounter  Medication  Reason  . atorvastatin (LIPITOR) 10 MG tablet Duplicate  . propranolol (INDERAL) 60 MG tablet Duplicate  . amLODipine (NORVASC) 10 MG tablet Duplicate  . clopidogrel (PLAVIX) 75 MG tablet Duplicate   Return in about 3 months (around 11/07/2013) for Recheck medical problems.  Jeniyah Menor P. Jacelyn Grip, M.D.

## 2013-08-08 NOTE — Patient Instructions (Signed)
Fall Prevention and Home Safety °Falls cause injuries and can affect all age groups. It is possible to prevent falls.  °HOW TO PREVENT FALLS °· Wear shoes with rubber soles that do not have an opening for your toes. °· Keep the inside and outside of your house well lit. °· Use night lights throughout your home. °· Remove clutter from floors. °· Clean up floor spills. °· Remove throw rugs or fasten them to the floor with carpet tape. °· Do not place electrical cords across pathways. °· Put grab bars by your tub, shower, and toilet. Do not use towel bars as grab bars. °· Put handrails on both sides of the stairway. Fix loose handrails. °· Do not climb on stools or stepladders, if possible. °· Do not wax your floors. °· Repair uneven or unsafe sidewalks, walkways, or stairs. °· Keep items you use a lot within reach. °· Be aware of pets. °· Keep emergency numbers next to the telephone. °· Put smoke detectors in your home and near bedrooms. °Ask your doctor what other things you can do to prevent falls. °Document Released: 02/11/2009 Document Revised: 10/17/2011 Document Reviewed: 07/18/2011 °[employer]® Patient Information ©2014 [employer]. ° °

## 2013-08-10 ENCOUNTER — Other Ambulatory Visit: Payer: Self-pay | Admitting: Family Medicine

## 2013-08-10 DIAGNOSIS — R899 Unspecified abnormal finding in specimens from other organs, systems and tissues: Secondary | ICD-10-CM

## 2013-08-10 LAB — NMR, LIPOPROFILE
Cholesterol: 110 mg/dL (ref <200)
HDL Cholesterol by NMR: 49 mg/dL (ref >=40)
HDL Particle Number: 31.1 umol/L (ref >=30.5)
LDL Particle Number: 643 nmol/L (ref <1000)
LDL Size: 20.9 nm (ref >20.5)
LDLC SERPL CALC-MCNC: 43 mg/dL (ref <100)
LP-IR Score: <25 (ref <=45)
Small LDL Particle Number: <90 nmol/L (ref <=527)
Triglycerides by NMR: 89 mg/dL (ref <150)

## 2013-08-10 LAB — CMP14+EGFR
ALT: 9 IU/L (ref 0–44)
AST: 17 IU/L (ref 0–40)
Albumin/Globulin Ratio: 1.3 (ref 1.1–2.5)
Albumin: 4.1 g/dL (ref 3.5–4.7)
Alkaline Phosphatase: 432 IU/L — ABNORMAL HIGH (ref 39–117)
BUN/Creatinine Ratio: 14 (ref 10–22)
BUN: 21 mg/dL (ref 8–27)
CO2: 26 mmol/L (ref 18–29)
Calcium: 10 mg/dL (ref 8.6–10.2)
Chloride: 102 mmol/L (ref 97–108)
Creatinine, Ser: 1.5 mg/dL — ABNORMAL HIGH (ref 0.76–1.27)
GFR calc Af Amer: 47 mL/min/{1.73_m2} — ABNORMAL LOW (ref >59)
GFR calc non Af Amer: 41 mL/min/{1.73_m2} — ABNORMAL LOW (ref >59)
Globulin, Total: 3.1 g/dL (ref 1.5–4.5)
Glucose: 103 mg/dL — ABNORMAL HIGH (ref 65–99)
Potassium: 4.6 mmol/L (ref 3.5–5.2)
Sodium: 143 mmol/L (ref 134–144)
Total Bilirubin: 0.7 mg/dL (ref 0.0–1.2)
Total Protein: 7.2 g/dL (ref 6.0–8.5)

## 2013-08-10 LAB — VITAMIN D 25 HYDROXY (VIT D DEFICIENCY, FRACTURES): Vit D, 25-Hydroxy: 52.2 ng/mL (ref 30.0–100.0)

## 2013-08-10 NOTE — Progress Notes (Signed)
Quick Note:  Call Patient Labs that are abnormal: Alkaline phosphatase jumped very high.  The rest are stable.  Recommendations: Needs to recheck within a week. Lab ordered.Need to look for bone disease.   ______

## 2013-08-19 ENCOUNTER — Other Ambulatory Visit (INDEPENDENT_AMBULATORY_CARE_PROVIDER_SITE_OTHER): Payer: Medicare Other

## 2013-08-19 DIAGNOSIS — R6889 Other general symptoms and signs: Secondary | ICD-10-CM

## 2013-08-19 DIAGNOSIS — R899 Unspecified abnormal finding in specimens from other organs, systems and tissues: Secondary | ICD-10-CM

## 2013-08-19 NOTE — Progress Notes (Signed)
Patient came in for labs only.

## 2013-08-21 ENCOUNTER — Other Ambulatory Visit (HOSPITAL_COMMUNITY): Payer: Self-pay | Admitting: Urology

## 2013-08-21 DIAGNOSIS — C61 Malignant neoplasm of prostate: Secondary | ICD-10-CM

## 2013-08-21 LAB — ALKALINE PHOSPHATASE, ISOENZYMES
Alkaline Phosphatase: 466 IU/L — ABNORMAL HIGH (ref 39–117)
BONE FRACTION: 90 % — ABNORMAL HIGH (ref 12–68)
INTESTINAL FRAC.: 0 % (ref 0–18)
LIVER FRACTION: 10 % — ABNORMAL LOW (ref 13–88)

## 2013-08-24 NOTE — Progress Notes (Signed)
Quick Note:  Call Patient Labs that are abnormal: The alkaline phosphatase is very high and the test confirms that something is happening in the bones. I am concerned that it may be related to his history of prostate cancer.  Recommendations: Please make an appointment for the patient with a provider with a family member present: To discuss these results.so that decisions can be made as to the next steps.   ______

## 2013-08-26 ENCOUNTER — Encounter (HOSPITAL_COMMUNITY)
Admission: RE | Admit: 2013-08-26 | Discharge: 2013-08-26 | Disposition: A | Discharge status: Home / Self Care | Payer: Medicare Other | Source: Ambulatory Visit | Attending: Urology | Admitting: Urology

## 2013-08-26 ENCOUNTER — Ambulatory Visit (HOSPITAL_COMMUNITY)
Admission: RE | Admit: 2013-08-26 | Discharge: 2013-08-26 | Disposition: A | Discharge status: Home / Self Care | Payer: Medicare Other | Source: Ambulatory Visit | Attending: Urology | Admitting: Urology

## 2013-08-26 DIAGNOSIS — C61 Malignant neoplasm of prostate: Secondary | ICD-10-CM | POA: Insufficient documentation

## 2013-08-26 DIAGNOSIS — C7951 Secondary malignant neoplasm of bone: Secondary | ICD-10-CM | POA: Insufficient documentation

## 2013-08-26 DIAGNOSIS — C7952 Secondary malignant neoplasm of bone marrow: Secondary | ICD-10-CM

## 2013-08-26 MED ORDER — TECHNETIUM TC 99M MEDRONATE IV KIT
24.9000 | PACK | Freq: Once | INTRAVENOUS | Status: AC | PRN
  Administered 2013-08-26: 24.9 via INTRAVENOUS

## 2013-09-18 ENCOUNTER — Other Ambulatory Visit: Payer: Self-pay | Admitting: *Deleted

## 2013-09-18 MED ORDER — TEMAZEPAM 15 MG PO CAPS: ORAL_CAPSULE | ORAL | Status: AC

## 2013-09-18 NOTE — Telephone Encounter (Signed)
Patient last seen in office on 08-08-13 by Jacelyn Grip. Rx last filled on 09-14-13. Please advise. If approved please route to pool A so nurse can phone in to [facility] in [place]

## 2013-09-18 NOTE — Telephone Encounter (Signed)
This medication is okay to refill. This patient should make an appointment to be seen in the next couple of months by someone in our office.

## 2013-09-22 ENCOUNTER — Other Ambulatory Visit: Payer: Self-pay | Admitting: *Deleted

## 2013-09-22 DIAGNOSIS — I10 Essential (primary) hypertension: Secondary | ICD-10-CM

## 2013-09-22 MED ORDER — HYDROCHLOROTHIAZIDE 12.5 MG PO TABS: 12.5000 mg | ORAL_TABLET | Freq: Every day | ORAL | Status: DC

## 2013-09-23 ENCOUNTER — Other Ambulatory Visit: Payer: Self-pay

## 2013-09-23 MED ORDER — PROPRANOLOL HCL 60 MG PO TABS: ORAL_TABLET | ORAL | Status: DC

## 2013-09-24 ENCOUNTER — Other Ambulatory Visit: Payer: Self-pay | Admitting: *Deleted

## 2013-09-24 MED ORDER — CLOPIDOGREL BISULFATE 75 MG PO TABS: ORAL_TABLET | ORAL | Status: DC

## 2013-09-28 ENCOUNTER — Other Ambulatory Visit: Payer: Self-pay | Admitting: Nurse Practitioner

## 2013-09-29 ENCOUNTER — Other Ambulatory Visit: Payer: Self-pay

## 2013-09-29 MED ORDER — ATORVASTATIN CALCIUM 10 MG PO TABS: ORAL_TABLET | ORAL | Status: DC

## 2013-09-29 MED ORDER — AMLODIPINE BESYLATE 10 MG PO TABS: ORAL_TABLET | ORAL | Status: DC

## 2013-10-15 ENCOUNTER — Other Ambulatory Visit: Payer: Self-pay | Admitting: Nurse Practitioner

## 2013-10-15 NOTE — Telephone Encounter (Signed)
Called into [facility]

## 2013-10-27 ENCOUNTER — Other Ambulatory Visit: Payer: Self-pay | Admitting: *Deleted

## 2013-10-27 MED ORDER — CLOPIDOGREL BISULFATE 75 MG PO TABS: ORAL_TABLET | ORAL | Status: DC

## 2013-10-29 ENCOUNTER — Other Ambulatory Visit: Payer: Self-pay | Admitting: *Deleted

## 2013-10-29 MED ORDER — AMLODIPINE BESYLATE 10 MG PO TABS: ORAL_TABLET | ORAL | Status: DC

## 2013-10-29 MED ORDER — ATORVASTATIN CALCIUM 10 MG PO TABS: ORAL_TABLET | ORAL | Status: DC

## 2013-10-29 MED ORDER — PROPRANOLOL HCL 60 MG PO TABS: ORAL_TABLET | ORAL | Status: DC

## 2013-11-08 ENCOUNTER — Other Ambulatory Visit: Payer: Self-pay | Admitting: Family Medicine

## 2013-11-11 ENCOUNTER — Ambulatory Visit: Payer: Medicare Other | Admitting: Family

## 2013-11-27 ENCOUNTER — Other Ambulatory Visit: Payer: Self-pay | Admitting: Family Medicine

## 2013-12-08 ENCOUNTER — Encounter: Payer: Self-pay | Admitting: Family Medicine

## 2013-12-08 ENCOUNTER — Ambulatory Visit (INDEPENDENT_AMBULATORY_CARE_PROVIDER_SITE_OTHER): Payer: Medicare Other | Admitting: Family Medicine

## 2013-12-08 VITALS — BP 158/80 | HR 82 | Temp 97.2°F | Ht 71.0 in | Wt 161.0 lb

## 2013-12-08 DIAGNOSIS — R748 Abnormal levels of other serum enzymes: Secondary | ICD-10-CM

## 2013-12-08 DIAGNOSIS — I10 Essential (primary) hypertension: Secondary | ICD-10-CM

## 2013-12-08 LAB — POCT CBC
Granulocyte percent: 69.5 %G (ref 37–80)
HCT, POC: 40.8 % — AB (ref 43.5–53.7)
Hemoglobin: 13.2 g/dL — AB (ref 14.1–18.1)
Lymph, poc: 3.2 (ref 0.6–3.4)
MCH, POC: 28.2 pg (ref 27–31.2)
MCHC: 32.3 g/dL (ref 31.8–35.4)
MCV: 87.1 fL (ref 80–97)
MPV: 9.2 fL (ref 0–99.8)
POC Granulocyte: 8.1 — AB (ref 2–6.9)
POC LYMPH PERCENT: 28 %L (ref 10–50)
Platelet Count, POC: 364 10*3/uL (ref 142–424)
RBC: 4.7 M/uL (ref 4.69–6.13)
RDW, POC: 15.9 %
WBC: 11.6 10*3/uL — AB (ref 4.6–10.2)

## 2013-12-08 MED ORDER — HYDROCHLOROTHIAZIDE 12.5 MG PO TABS: 12.5000 mg | ORAL_TABLET | Freq: Every day | ORAL | Status: AC

## 2013-12-08 NOTE — Progress Notes (Signed)
   Subjective:    Patient ID: Eagan Shifflett, male    DOB: [birth year], 80 y.o.   MRN: [id number]  HPI Patient is here for follow up.  He has hx of CVA, HTN, prostate cancer, and he has been having elevated alk phos levels.  He is seeing Urology for his prostate cancer.   Review of Systems No chest pain, SOB, HA, dizziness, vision change, N/V, diarrhea, constipation, dysuria, urinary urgency or frequency, myalgias, arthralgias or rash.     Objective:   Physical Exam  Vital signs noted  Cacetic chronically ill appearing male with HOH.  HEENT - Head atraumatic Normocephalic                Eyes - PERRLA, Conjuctiva - clear Sclera- Clear EOMI                Ears - EAC's Wnl TM's wnl                Throat - oropharanx wnl Respiratory - Lungs CTA bilateral Cardiac - RRR S1 and S2 without murmur GI - Abdomen soft Nontender and bowel sounds active x 4      Assessment & Plan:  Essential hypertension, benign - Plan: POCT CBC, CMP14+EGFR, Alkaline phosphatase, Alkaline Phosphatase, Isoenzymes  Essential hypertension - Plan: hydrochlorothiazide (HYDRODIURIL) 12.5 MG tablet  Elevated alkaline phosphatase level - Plan: Alkaline phosphatase, Alkaline Phosphatase, Isoenzymes  Follow up in 27month  WLysbeth PennerFNP

## 2013-12-10 LAB — CMP14+EGFR
ALT: 7 IU/L (ref 0–44)
AST: 16 IU/L (ref 0–40)
Albumin/Globulin Ratio: 1.6 (ref 1.1–2.5)
Albumin: 4.2 g/dL (ref 3.5–4.7)
Alkaline Phosphatase: 168 IU/L — ABNORMAL HIGH (ref 39–117)
BUN/Creatinine Ratio: 13 (ref 10–22)
BUN: 18 mg/dL (ref 8–27)
CO2: 21 mmol/L (ref 18–29)
Calcium: 10.2 mg/dL (ref 8.6–10.2)
Chloride: 103 mmol/L (ref 97–108)
Creatinine, Ser: 1.44 mg/dL — ABNORMAL HIGH (ref 0.76–1.27)
GFR calc Af Amer: 50 mL/min/{1.73_m2} — ABNORMAL LOW (ref >59)
GFR calc non Af Amer: 43 mL/min/{1.73_m2} — ABNORMAL LOW (ref >59)
Globulin, Total: 2.6 g/dL (ref 1.5–4.5)
Glucose: 128 mg/dL — ABNORMAL HIGH (ref 65–99)
Potassium: 4.3 mmol/L (ref 3.5–5.2)
Sodium: 143 mmol/L (ref 134–144)
Total Bilirubin: 1 mg/dL (ref 0.0–1.2)
Total Protein: 6.8 g/dL (ref 6.0–8.5)

## 2013-12-10 LAB — ALKALINE PHOSPHATASE, ISOENZYMES
BONE FRACTION: 79 % — ABNORMAL HIGH (ref 12–68)
INTESTINAL FRAC.: 3 % (ref 0–18)
LIVER FRACTION: 18 % (ref 13–88)

## 2014-01-25 ENCOUNTER — Other Ambulatory Visit: Payer: Self-pay | Admitting: Family Medicine

## 2014-02-09 ENCOUNTER — Other Ambulatory Visit: Payer: Self-pay | Admitting: Family Medicine

## 2014-02-17 ENCOUNTER — Telehealth: Payer: Self-pay | Admitting: Family Medicine

## 2014-02-17 NOTE — Telephone Encounter (Signed)
Appt given for Friday per patients request

## 2014-02-20 ENCOUNTER — Encounter: Payer: Self-pay | Admitting: Family Medicine

## 2014-02-20 ENCOUNTER — Ambulatory Visit (INDEPENDENT_AMBULATORY_CARE_PROVIDER_SITE_OTHER): Payer: Medicare Other | Admitting: Family Medicine

## 2014-02-20 VITALS — BP 110/63 | HR 73 | Temp 96.7°F | Ht 71.0 in | Wt 140.0 lb

## 2014-02-20 DIAGNOSIS — R5382 Chronic fatigue, unspecified: Secondary | ICD-10-CM

## 2014-02-20 DIAGNOSIS — R059 Cough, unspecified: Secondary | ICD-10-CM

## 2014-02-20 DIAGNOSIS — R05 Cough: Secondary | ICD-10-CM

## 2014-02-20 DIAGNOSIS — N390 Urinary tract infection, site not specified: Secondary | ICD-10-CM

## 2014-02-20 DIAGNOSIS — R319 Hematuria, unspecified: Secondary | ICD-10-CM

## 2014-02-20 LAB — POCT CBC
Granulocyte percent: 58.5 %G (ref 37–80)
HCT, POC: 40.7 % — AB (ref 43.5–53.7)
Hemoglobin: 13.3 g/dL — AB (ref 14.1–18.1)
Lymph, poc: 4.3 — AB (ref 0.6–3.4)
MCH, POC: 30.4 pg (ref 27–31.2)
MCHC: 32.6 g/dL (ref 31.8–35.4)
MCV: 93.4 fL (ref 80–97)
MPV: 9.6 fL (ref 0–99.8)
POC Granulocyte: 7 — AB (ref 2–6.9)
POC LYMPH PERCENT: 36.1 %L (ref 10–50)
Platelet Count, POC: 311 10*3/uL (ref 142–424)
RBC: 4.4 M/uL — AB (ref 4.69–6.13)
RDW, POC: 15.8 %
WBC: 12 10*3/uL — AB (ref 4.6–10.2)

## 2014-02-20 LAB — POCT UA - MICROSCOPIC ONLY
Bacteria, U Microscopic: NEGATIVE
Casts, Ur, LPF, POC: NEGATIVE
Crystals, Ur, HPF, POC: NEGATIVE
Yeast, UA: NEGATIVE

## 2014-02-20 LAB — POCT URINALYSIS DIPSTICK
Bilirubin, UA: NEGATIVE
Blood, UA: NEGATIVE
Glucose, UA: NEGATIVE
Ketones, UA: NEGATIVE
Leukocytes, UA: NEGATIVE
Nitrite, UA: NEGATIVE
Spec Grav, UA: 1.015
Urobilinogen, UA: NEGATIVE
pH, UA: 5

## 2014-02-20 MED ORDER — CIPROFLOXACIN HCL 250 MG PO TABS: 250.0000 mg | ORAL_TABLET | Freq: Two times a day (BID) | ORAL | Status: AC

## 2014-02-20 MED ORDER — CIPROFLOXACIN HCL 250 MG PO TABS: 250.0000 mg | ORAL_TABLET | Freq: Two times a day (BID) | ORAL | Status: DC

## 2014-02-20 MED ORDER — MEGESTROL ACETATE 40 MG/ML PO SUSP: 800.0000 mg | Freq: Every day | ORAL | Status: AC

## 2014-02-20 NOTE — Progress Notes (Signed)
Subjective:    Patient ID: Wesley Lucas, male    DOB: 17-Dec-1923, 80 y.o.   MRN: [id number]  HPI C/o fatigue and weakness in his legs. He is accompanied by his wife.  He c/o pain in his knees bilateral.  He does not know why he is tired  Review of Systems  Constitutional: Positive for activity change, appetite change and fatigue. Negative for fever.  HENT: Negative for ear pain.   Eyes: Negative for discharge.  Respiratory: Positive for cough.   Cardiovascular: Negative for chest pain.  Gastrointestinal: Negative for abdominal distention.  Endocrine: Negative for polyuria.  Genitourinary: Negative for difficulty urinating.  Musculoskeletal: Positive for myalgias. Negative for gait problem and neck pain.       C/o bilateral knee pain  Skin: Negative for color change and rash.  Neurological: Negative for speech difficulty and headaches.  Psychiatric/Behavioral: Negative for agitation.       Objective:    BP 110/63  Pulse 73  Temp(Src) 96.7 F (35.9 C) (Oral)  Ht _0  (1.803 m)  Wt 140 lb (63.504 kg)  BMI 19.53 kg/m2 Physical Exam  Constitutional: He is oriented to person, place, and time.  Chronically ill and cachetic elderly male with walker  HENT:  Head: Normocephalic and atraumatic.  Mouth/Throat: Oropharynx is clear and moist.  Eyes: Pupils are equal, round, and reactive to light.  Neck: Normal range of motion. Neck supple.  Cardiovascular: Normal rate and regular rhythm.   No murmur heard. Pulmonary/Chest: Effort normal and breath sounds normal.  Abdominal: Soft. Bowel sounds are normal. There is no tenderness.  Neurological: He is alert and oriented to person, place, and time.  Skin: Skin is warm and dry.  Psychiatric: He has a normal mood and affect.     Results for orders placed in visit on 02/20/14  POCT CBC      Result Value Ref Range   WBC 12.0 (*) 4.6 - 10.2 K/uL   Lymph, poc 4.3 (*) 0.6 - 3.4   POC LYMPH PERCENT 36.1  10 - 50 %L   POC  Granulocyte 7.0 (*) 2 - 6.9   Granulocyte percent 58.5  37 - 80 %G   RBC 4.4 (*) 4.69 - 6.13 M/uL   Hemoglobin 13.3 (*) 14.1 - 18.1 g/dL   HCT, POC 40.7 (*) 43.5 - 53.7 %   MCV 93.4  80 - 97 fL   MCH, POC 30.4  27 - 31.2 pg   MCHC 32.6  31.8 - 35.4 g/dL   RDW, POC 15.8     Platelet Count, POC 311.0  142 - 424 K/uL   MPV 9.6  0 - 99.8 fL  POCT UA - MICROSCOPIC ONLY      Result Value Ref Range   WBC, Ur, HPF, POC 5-10     RBC, urine, microscopic FEW     Bacteria, U Microscopic NEG     Mucus, UA MOD     Epithelial cells, urine per micros OCC     Crystals, Ur, HPF, POC NEG     Casts, Ur, LPF, POC NEG     Yeast, UA NEG    POCT URINALYSIS DIPSTICK      Result Value Ref Range   Color, UA AMBER     Clarity, UA CLEAR     Glucose, UA NEG     Bilirubin, UA NEG     Ketones, UA NEG     Spec Grav, UA 1.015     Blood,  UA NEG     pH, UA 5.0     Protein, UA 2+     Urobilinogen, UA negative     Nitrite, UA NEG     Leukocytes, UA Negative         Assessment & Plan:     ICD-9-CM ICD-10-CM   1. Chronic fatigue 780.79 R53.82 POCT CBC     POCT UA - Microscopic Only     POCT urinalysis dipstick     BMP8+EGFR     megestrol (MEGACE) 40 MG/ML suspension     CANCELED: DG Chest 2 View  2. Cough 786.2 R05 POCT CBC     POCT UA - Microscopic Only     POCT urinalysis dipstick     CANCELED: DG Chest 2 View  3. Urinary tract infection with hematuria, site unspecified 599.0 N39.0 Urine culture    R31.9 ciprofloxacin (CIPRO) 250 MG tablet     DISCONTINUED: ciprofloxacin (CIPRO) 250 MG tablet     Return in about 3 days (around 02/23/2014).  Lysbeth Penner FNP

## 2014-02-21 LAB — BMP8+EGFR
BUN/Creatinine Ratio: 13 (ref 10–22)
BUN: 22 mg/dL (ref 8–27)
CO2: 22 mmol/L (ref 18–29)
Calcium: 10.4 mg/dL — ABNORMAL HIGH (ref 8.6–10.2)
Chloride: 99 mmol/L (ref 97–108)
Creatinine, Ser: 1.67 mg/dL — ABNORMAL HIGH (ref 0.76–1.27)
GFR calc Af Amer: 42 mL/min/{1.73_m2} — ABNORMAL LOW (ref >59)
GFR calc non Af Amer: 36 mL/min/{1.73_m2} — ABNORMAL LOW (ref >59)
Glucose: 160 mg/dL — ABNORMAL HIGH (ref 65–99)
Potassium: 3.8 mmol/L (ref 3.5–5.2)
Sodium: 143 mmol/L (ref 134–144)

## 2014-02-21 LAB — URINE CULTURE

## 2014-02-23 ENCOUNTER — Ambulatory Visit (INDEPENDENT_AMBULATORY_CARE_PROVIDER_SITE_OTHER): Payer: Medicare Other | Admitting: Family Medicine

## 2014-02-23 VITALS — BP 102/60 | HR 86 | Temp 96.5°F | Ht 71.0 in | Wt 140.0 lb

## 2014-02-23 DIAGNOSIS — F5000 Anorexia nervosa, unspecified: Secondary | ICD-10-CM

## 2014-02-23 NOTE — Progress Notes (Signed)
   Subjective:    Patient ID: Wesley Lucas, male    DOB: July 06, 1923, 80 y.o.   MRN: [id number]  HPI He is here for follow up.  He has not been eating and drinking very well and he is weak.  He had some wbc's in his UA so he has been tx'd with low dose cipro for UTI.  His ua cx was negative. He had labs which show stable stage 3 CKD and he did have elevated glucose and wbc's which he has hx of leukocytosis.  Review of Systems  Constitutional: Positive for appetite change and fatigue. Negative for fever.  HENT: Negative for ear pain.   Eyes: Negative for discharge.  Respiratory: Negative for cough.   Cardiovascular: Negative for chest pain.  Gastrointestinal: Negative for abdominal distention.  Endocrine: Negative for polyuria.  Genitourinary: Negative for difficulty urinating.  Musculoskeletal: Negative for gait problem and neck pain.  Skin: Negative for color change and rash.  Neurological: Negative for speech difficulty and headaches.  Psychiatric/Behavioral: Negative for agitation.       Objective:    BP 102/60  Pulse 86  Temp(Src) 96.5 F (35.8 C) (Oral)  Ht 5\' 11"  (1.803 m)  Wt 140 lb (63.504 kg)  BMI 19.53 kg/m2 Physical Exam  Constitutional: He is oriented to person, place, and time. He appears well-developed and well-nourished.  HENT:  Head: Normocephalic and atraumatic.  Mouth/Throat: Oropharynx is clear and moist.  Eyes: Pupils are equal, round, and reactive to light.  Neck: Normal range of motion. Neck supple.  Cardiovascular: Normal rate and regular rhythm.   No murmur heard. Pulmonary/Chest: Effort normal and breath sounds normal.  Abdominal: Soft. Bowel sounds are normal. There is no tenderness.  Neurological: He is alert and oriented to person, place, and time.  Skin: Skin is warm and dry.  Psychiatric: He has a normal mood and affect.          Assessment & Plan:     ICD-9-CM ICD-10-CM  1. Anorexia nervosa 307.1 F50.00   He did not get megace  from pharmacy and rx periactin 2mg  po bid #60 w/3rf  Return if symptoms worsen or fail to improve.  Lysbeth Penner FNP

## 2014-03-03 ENCOUNTER — Other Ambulatory Visit: Payer: Self-pay | Admitting: Family Medicine

## 2014-03-17 ENCOUNTER — Telehealth: Payer: Self-pay | Admitting: Family Medicine

## 2014-03-17 ENCOUNTER — Other Ambulatory Visit: Payer: Self-pay | Admitting: *Deleted

## 2014-03-17 ENCOUNTER — Ambulatory Visit (INDEPENDENT_AMBULATORY_CARE_PROVIDER_SITE_OTHER): Payer: Medicare Other | Admitting: *Deleted

## 2014-03-17 DIAGNOSIS — Z111 Encounter for screening for respiratory tuberculosis: Secondary | ICD-10-CM

## 2014-03-17 NOTE — Telephone Encounter (Signed)
All taken care of paperwork done, pt will be admitted 03/18/14

## 2014-03-27 ENCOUNTER — Other Ambulatory Visit: Payer: Self-pay | Admitting: Family

## 2014-03-31 ENCOUNTER — Other Ambulatory Visit: Payer: Self-pay | Admitting: Family

## 2014-04-03 ENCOUNTER — Other Ambulatory Visit: Payer: Self-pay | Admitting: Family Medicine

## 2014-04-23 ENCOUNTER — Other Ambulatory Visit: Payer: Self-pay | Admitting: Family Medicine

## 2014-04-23 NOTE — Telephone Encounter (Signed)
Rx okayed per [employer]

## 2014-04-27 ENCOUNTER — Other Ambulatory Visit: Payer: Self-pay | Admitting: Family Medicine

## 2014-05-04 ENCOUNTER — Other Ambulatory Visit: Payer: Self-pay | Admitting: Family Medicine

## 2014-05-05 ENCOUNTER — Other Ambulatory Visit: Payer: Self-pay | Admitting: Family Medicine

## 2014-05-06 ENCOUNTER — Other Ambulatory Visit: Payer: Self-pay | Admitting: Family Medicine

## 2014-05-19 ENCOUNTER — Other Ambulatory Visit: Payer: Self-pay | Admitting: *Deleted

## 2014-05-19 DIAGNOSIS — I700 Atherosclerosis of aorta: Secondary | ICD-10-CM | POA: Diagnosis not present

## 2014-05-19 DIAGNOSIS — R4182 Altered mental status, unspecified: Secondary | ICD-10-CM | POA: Diagnosis not present

## 2014-05-19 DIAGNOSIS — Z791 Long term (current) use of non-steroidal anti-inflammatories (NSAID): Secondary | ICD-10-CM | POA: Diagnosis not present

## 2014-05-19 DIAGNOSIS — R531 Weakness: Secondary | ICD-10-CM | POA: Diagnosis not present

## 2014-05-19 DIAGNOSIS — W06XXXA Fall from bed, initial encounter: Secondary | ICD-10-CM | POA: Diagnosis not present

## 2014-05-19 DIAGNOSIS — M25552 Pain in left hip: Secondary | ICD-10-CM | POA: Diagnosis not present

## 2014-05-19 DIAGNOSIS — G319 Degenerative disease of nervous system, unspecified: Secondary | ICD-10-CM | POA: Diagnosis not present

## 2014-05-19 DIAGNOSIS — Z8673 Personal history of transient ischemic attack (TIA), and cerebral infarction without residual deficits: Secondary | ICD-10-CM | POA: Diagnosis not present

## 2014-05-19 DIAGNOSIS — Z79899 Other long term (current) drug therapy: Secondary | ICD-10-CM | POA: Diagnosis not present

## 2014-05-19 DIAGNOSIS — T1490 Injury, unspecified: Secondary | ICD-10-CM | POA: Diagnosis not present

## 2014-05-20 ENCOUNTER — Telehealth: Payer: Self-pay | Admitting: Family Medicine

## 2014-05-20 NOTE — Telephone Encounter (Signed)
Papers ready, wife aware

## 2014-05-21 DIAGNOSIS — M6281 Muscle weakness (generalized): Secondary | ICD-10-CM | POA: Diagnosis not present

## 2014-05-21 DIAGNOSIS — F5000 Anorexia nervosa, unspecified: Secondary | ICD-10-CM | POA: Diagnosis not present

## 2014-05-21 DIAGNOSIS — R41841 Cognitive communication deficit: Secondary | ICD-10-CM | POA: Diagnosis not present

## 2014-05-21 DIAGNOSIS — N189 Chronic kidney disease, unspecified: Secondary | ICD-10-CM | POA: Diagnosis not present

## 2014-05-21 DIAGNOSIS — R269 Unspecified abnormalities of gait and mobility: Secondary | ICD-10-CM | POA: Diagnosis not present

## 2014-05-21 DIAGNOSIS — R972 Elevated prostate specific antigen [PSA]: Secondary | ICD-10-CM | POA: Diagnosis not present

## 2014-05-21 DIAGNOSIS — R609 Edema, unspecified: Secondary | ICD-10-CM | POA: Diagnosis not present

## 2014-05-21 DIAGNOSIS — I10 Essential (primary) hypertension: Secondary | ICD-10-CM | POA: Diagnosis not present

## 2014-05-21 DIAGNOSIS — D518 Other vitamin B12 deficiency anemias: Secondary | ICD-10-CM | POA: Diagnosis not present

## 2014-05-21 DIAGNOSIS — Z9181 History of falling: Secondary | ICD-10-CM | POA: Diagnosis not present

## 2014-05-21 DIAGNOSIS — I69398 Other sequelae of cerebral infarction: Secondary | ICD-10-CM | POA: Diagnosis not present

## 2014-05-21 DIAGNOSIS — E559 Vitamin D deficiency, unspecified: Secondary | ICD-10-CM | POA: Diagnosis not present

## 2014-05-21 DIAGNOSIS — C61 Malignant neoplasm of prostate: Secondary | ICD-10-CM | POA: Diagnosis not present

## 2014-05-21 DIAGNOSIS — E785 Hyperlipidemia, unspecified: Secondary | ICD-10-CM | POA: Diagnosis not present

## 2014-05-21 DIAGNOSIS — G4700 Insomnia, unspecified: Secondary | ICD-10-CM | POA: Diagnosis not present

## 2014-06-19 ENCOUNTER — Other Ambulatory Visit: Payer: Self-pay | Admitting: Family Medicine

## 2014-06-29 ENCOUNTER — Other Ambulatory Visit: Payer: Self-pay | Admitting: Family Medicine

## 2014-07-16 DIAGNOSIS — C61 Malignant neoplasm of prostate: Secondary | ICD-10-CM | POA: Diagnosis not present

## 2014-07-16 DIAGNOSIS — N189 Chronic kidney disease, unspecified: Secondary | ICD-10-CM | POA: Diagnosis not present

## 2014-07-16 DIAGNOSIS — R609 Edema, unspecified: Secondary | ICD-10-CM | POA: Diagnosis not present

## 2014-07-16 DIAGNOSIS — I10 Essential (primary) hypertension: Secondary | ICD-10-CM | POA: Diagnosis not present

## 2014-07-16 DIAGNOSIS — F5000 Anorexia nervosa, unspecified: Secondary | ICD-10-CM | POA: Diagnosis not present

## 2014-08-13 DIAGNOSIS — I10 Essential (primary) hypertension: Secondary | ICD-10-CM | POA: Diagnosis not present

## 2014-10-01 DIAGNOSIS — F339 Major depressive disorder, recurrent, unspecified: Secondary | ICD-10-CM | POA: Diagnosis not present

## 2014-10-01 DIAGNOSIS — R531 Weakness: Secondary | ICD-10-CM | POA: Diagnosis not present

## 2014-10-01 DIAGNOSIS — D638 Anemia in other chronic diseases classified elsewhere: Secondary | ICD-10-CM | POA: Diagnosis not present

## 2014-10-01 DIAGNOSIS — R609 Edema, unspecified: Secondary | ICD-10-CM | POA: Diagnosis not present

## 2014-10-01 DIAGNOSIS — L8942 Pressure ulcer of contiguous site of back, buttock and hip, stage 2: Secondary | ICD-10-CM | POA: Diagnosis not present

## 2014-11-10 DIAGNOSIS — I10 Essential (primary) hypertension: Secondary | ICD-10-CM | POA: Diagnosis not present

## 2014-12-06 DIAGNOSIS — J449 Chronic obstructive pulmonary disease, unspecified: Secondary | ICD-10-CM | POA: Diagnosis not present

## 2014-12-06 DIAGNOSIS — R609 Edema, unspecified: Secondary | ICD-10-CM | POA: Diagnosis not present

## 2014-12-06 DIAGNOSIS — L8942 Pressure ulcer of contiguous site of back, buttock and hip, stage 2: Secondary | ICD-10-CM | POA: Diagnosis not present

## 2014-12-06 DIAGNOSIS — I688 Other cerebrovascular disorders in diseases classified elsewhere: Secondary | ICD-10-CM | POA: Diagnosis not present

## 2014-12-06 DIAGNOSIS — F0390 Unspecified dementia without behavioral disturbance: Secondary | ICD-10-CM | POA: Diagnosis not present

## 2014-12-08 DIAGNOSIS — S62615A Displaced fracture of proximal phalanx of left ring finger, initial encounter for closed fracture: Secondary | ICD-10-CM | POA: Diagnosis not present

## 2014-12-16 DIAGNOSIS — S60222A Contusion of left hand, initial encounter: Secondary | ICD-10-CM | POA: Diagnosis not present

## 2014-12-16 DIAGNOSIS — M79642 Pain in left hand: Secondary | ICD-10-CM | POA: Diagnosis not present

## 2015-01-11 DIAGNOSIS — B351 Tinea unguium: Secondary | ICD-10-CM | POA: Diagnosis not present

## 2015-01-11 DIAGNOSIS — M79675 Pain in left toe(s): Secondary | ICD-10-CM | POA: Diagnosis not present

## 2015-01-11 DIAGNOSIS — M79674 Pain in right toe(s): Secondary | ICD-10-CM | POA: Diagnosis not present

## 2015-02-11 DIAGNOSIS — I10 Essential (primary) hypertension: Secondary | ICD-10-CM | POA: Diagnosis not present

## 2015-02-18 DIAGNOSIS — I688 Other cerebrovascular disorders in diseases classified elsewhere: Secondary | ICD-10-CM | POA: Diagnosis not present

## 2015-02-18 DIAGNOSIS — L8942 Pressure ulcer of contiguous site of back, buttock and hip, stage 2: Secondary | ICD-10-CM | POA: Diagnosis not present

## 2015-02-18 DIAGNOSIS — R609 Edema, unspecified: Secondary | ICD-10-CM | POA: Diagnosis not present

## 2015-02-18 DIAGNOSIS — F0390 Unspecified dementia without behavioral disturbance: Secondary | ICD-10-CM | POA: Diagnosis not present

## 2015-02-18 DIAGNOSIS — J449 Chronic obstructive pulmonary disease, unspecified: Secondary | ICD-10-CM | POA: Diagnosis not present

## 2015-03-04 DIAGNOSIS — C61 Malignant neoplasm of prostate: Secondary | ICD-10-CM | POA: Diagnosis not present

## 2015-03-04 DIAGNOSIS — F5000 Anorexia nervosa, unspecified: Secondary | ICD-10-CM | POA: Diagnosis not present

## 2015-03-04 DIAGNOSIS — N189 Chronic kidney disease, unspecified: Secondary | ICD-10-CM | POA: Diagnosis not present

## 2015-03-04 DIAGNOSIS — I10 Essential (primary) hypertension: Secondary | ICD-10-CM | POA: Diagnosis not present

## 2015-03-04 DIAGNOSIS — F339 Major depressive disorder, recurrent, unspecified: Secondary | ICD-10-CM | POA: Diagnosis not present

## 2015-03-04 DIAGNOSIS — R627 Adult failure to thrive: Secondary | ICD-10-CM | POA: Diagnosis not present

## 2015-03-04 DIAGNOSIS — G4700 Insomnia, unspecified: Secondary | ICD-10-CM | POA: Diagnosis not present

## 2015-03-04 DIAGNOSIS — E785 Hyperlipidemia, unspecified: Secondary | ICD-10-CM | POA: Diagnosis not present

## 2015-03-04 DIAGNOSIS — J449 Chronic obstructive pulmonary disease, unspecified: Secondary | ICD-10-CM | POA: Diagnosis not present

## 2015-03-05 DIAGNOSIS — J449 Chronic obstructive pulmonary disease, unspecified: Secondary | ICD-10-CM | POA: Diagnosis not present

## 2015-03-05 DIAGNOSIS — F5000 Anorexia nervosa, unspecified: Secondary | ICD-10-CM | POA: Diagnosis not present

## 2015-03-05 DIAGNOSIS — E785 Hyperlipidemia, unspecified: Secondary | ICD-10-CM | POA: Diagnosis not present

## 2015-03-05 DIAGNOSIS — I10 Essential (primary) hypertension: Secondary | ICD-10-CM | POA: Diagnosis not present

## 2015-03-05 DIAGNOSIS — F339 Major depressive disorder, recurrent, unspecified: Secondary | ICD-10-CM | POA: Diagnosis not present

## 2015-03-05 DIAGNOSIS — G4700 Insomnia, unspecified: Secondary | ICD-10-CM | POA: Diagnosis not present

## 2015-03-05 DIAGNOSIS — R627 Adult failure to thrive: Secondary | ICD-10-CM | POA: Diagnosis not present

## 2015-03-05 DIAGNOSIS — N189 Chronic kidney disease, unspecified: Secondary | ICD-10-CM | POA: Diagnosis not present

## 2015-03-05 DIAGNOSIS — C61 Malignant neoplasm of prostate: Secondary | ICD-10-CM | POA: Diagnosis not present

## 2015-03-08 DIAGNOSIS — J449 Chronic obstructive pulmonary disease, unspecified: Secondary | ICD-10-CM | POA: Diagnosis not present

## 2015-03-08 DIAGNOSIS — N189 Chronic kidney disease, unspecified: Secondary | ICD-10-CM | POA: Diagnosis not present

## 2015-03-08 DIAGNOSIS — I10 Essential (primary) hypertension: Secondary | ICD-10-CM | POA: Diagnosis not present

## 2015-03-08 DIAGNOSIS — C61 Malignant neoplasm of prostate: Secondary | ICD-10-CM | POA: Diagnosis not present

## 2015-03-08 DIAGNOSIS — F5000 Anorexia nervosa, unspecified: Secondary | ICD-10-CM | POA: Diagnosis not present

## 2015-03-08 DIAGNOSIS — R627 Adult failure to thrive: Secondary | ICD-10-CM | POA: Diagnosis not present

## 2015-03-08 DIAGNOSIS — E785 Hyperlipidemia, unspecified: Secondary | ICD-10-CM | POA: Diagnosis not present

## 2015-03-08 DIAGNOSIS — G4700 Insomnia, unspecified: Secondary | ICD-10-CM | POA: Diagnosis not present

## 2015-03-08 DIAGNOSIS — F339 Major depressive disorder, recurrent, unspecified: Secondary | ICD-10-CM | POA: Diagnosis not present

## 2015-03-09 DIAGNOSIS — R627 Adult failure to thrive: Secondary | ICD-10-CM | POA: Diagnosis not present

## 2015-03-09 DIAGNOSIS — G4700 Insomnia, unspecified: Secondary | ICD-10-CM | POA: Diagnosis not present

## 2015-03-09 DIAGNOSIS — N189 Chronic kidney disease, unspecified: Secondary | ICD-10-CM | POA: Diagnosis not present

## 2015-03-09 DIAGNOSIS — C61 Malignant neoplasm of prostate: Secondary | ICD-10-CM | POA: Diagnosis not present

## 2015-03-09 DIAGNOSIS — F339 Major depressive disorder, recurrent, unspecified: Secondary | ICD-10-CM | POA: Diagnosis not present

## 2015-03-09 DIAGNOSIS — J449 Chronic obstructive pulmonary disease, unspecified: Secondary | ICD-10-CM | POA: Diagnosis not present

## 2015-03-09 DIAGNOSIS — E785 Hyperlipidemia, unspecified: Secondary | ICD-10-CM | POA: Diagnosis not present

## 2015-03-09 DIAGNOSIS — F5000 Anorexia nervosa, unspecified: Secondary | ICD-10-CM | POA: Diagnosis not present

## 2015-03-09 DIAGNOSIS — I10 Essential (primary) hypertension: Secondary | ICD-10-CM | POA: Diagnosis not present

## 2015-03-10 DIAGNOSIS — I10 Essential (primary) hypertension: Secondary | ICD-10-CM | POA: Diagnosis not present

## 2015-03-10 DIAGNOSIS — F5000 Anorexia nervosa, unspecified: Secondary | ICD-10-CM | POA: Diagnosis not present

## 2015-03-10 DIAGNOSIS — R627 Adult failure to thrive: Secondary | ICD-10-CM | POA: Diagnosis not present

## 2015-03-10 DIAGNOSIS — N189 Chronic kidney disease, unspecified: Secondary | ICD-10-CM | POA: Diagnosis not present

## 2015-03-10 DIAGNOSIS — J449 Chronic obstructive pulmonary disease, unspecified: Secondary | ICD-10-CM | POA: Diagnosis not present

## 2015-03-10 DIAGNOSIS — E785 Hyperlipidemia, unspecified: Secondary | ICD-10-CM | POA: Diagnosis not present

## 2015-03-10 DIAGNOSIS — G4700 Insomnia, unspecified: Secondary | ICD-10-CM | POA: Diagnosis not present

## 2015-03-10 DIAGNOSIS — C61 Malignant neoplasm of prostate: Secondary | ICD-10-CM | POA: Diagnosis not present

## 2015-03-10 DIAGNOSIS — F339 Major depressive disorder, recurrent, unspecified: Secondary | ICD-10-CM | POA: Diagnosis not present

## 2015-03-11 DIAGNOSIS — R627 Adult failure to thrive: Secondary | ICD-10-CM | POA: Diagnosis not present

## 2015-03-11 DIAGNOSIS — F5000 Anorexia nervosa, unspecified: Secondary | ICD-10-CM | POA: Diagnosis not present

## 2015-03-11 DIAGNOSIS — F339 Major depressive disorder, recurrent, unspecified: Secondary | ICD-10-CM | POA: Diagnosis not present

## 2015-03-11 DIAGNOSIS — I10 Essential (primary) hypertension: Secondary | ICD-10-CM | POA: Diagnosis not present

## 2015-03-11 DIAGNOSIS — G4700 Insomnia, unspecified: Secondary | ICD-10-CM | POA: Diagnosis not present

## 2015-03-11 DIAGNOSIS — C61 Malignant neoplasm of prostate: Secondary | ICD-10-CM | POA: Diagnosis not present

## 2015-03-11 DIAGNOSIS — E785 Hyperlipidemia, unspecified: Secondary | ICD-10-CM | POA: Diagnosis not present

## 2015-03-11 DIAGNOSIS — N189 Chronic kidney disease, unspecified: Secondary | ICD-10-CM | POA: Diagnosis not present

## 2015-03-11 DIAGNOSIS — J449 Chronic obstructive pulmonary disease, unspecified: Secondary | ICD-10-CM | POA: Diagnosis not present

## 2015-03-12 DIAGNOSIS — I10 Essential (primary) hypertension: Secondary | ICD-10-CM | POA: Diagnosis not present

## 2015-03-12 DIAGNOSIS — F339 Major depressive disorder, recurrent, unspecified: Secondary | ICD-10-CM | POA: Diagnosis not present

## 2015-03-12 DIAGNOSIS — J449 Chronic obstructive pulmonary disease, unspecified: Secondary | ICD-10-CM | POA: Diagnosis not present

## 2015-03-12 DIAGNOSIS — R627 Adult failure to thrive: Secondary | ICD-10-CM | POA: Diagnosis not present

## 2015-03-12 DIAGNOSIS — E785 Hyperlipidemia, unspecified: Secondary | ICD-10-CM | POA: Diagnosis not present

## 2015-03-12 DIAGNOSIS — N189 Chronic kidney disease, unspecified: Secondary | ICD-10-CM | POA: Diagnosis not present

## 2015-03-12 DIAGNOSIS — G4700 Insomnia, unspecified: Secondary | ICD-10-CM | POA: Diagnosis not present

## 2015-03-12 DIAGNOSIS — C61 Malignant neoplasm of prostate: Secondary | ICD-10-CM | POA: Diagnosis not present

## 2015-03-12 DIAGNOSIS — F5000 Anorexia nervosa, unspecified: Secondary | ICD-10-CM | POA: Diagnosis not present

## 2015-03-15 DIAGNOSIS — F339 Major depressive disorder, recurrent, unspecified: Secondary | ICD-10-CM | POA: Diagnosis not present

## 2015-03-15 DIAGNOSIS — E785 Hyperlipidemia, unspecified: Secondary | ICD-10-CM | POA: Diagnosis not present

## 2015-03-15 DIAGNOSIS — N189 Chronic kidney disease, unspecified: Secondary | ICD-10-CM | POA: Diagnosis not present

## 2015-03-15 DIAGNOSIS — J449 Chronic obstructive pulmonary disease, unspecified: Secondary | ICD-10-CM | POA: Diagnosis not present

## 2015-03-15 DIAGNOSIS — R627 Adult failure to thrive: Secondary | ICD-10-CM | POA: Diagnosis not present

## 2015-03-15 DIAGNOSIS — C61 Malignant neoplasm of prostate: Secondary | ICD-10-CM | POA: Diagnosis not present

## 2015-03-15 DIAGNOSIS — G4700 Insomnia, unspecified: Secondary | ICD-10-CM | POA: Diagnosis not present

## 2015-03-15 DIAGNOSIS — I10 Essential (primary) hypertension: Secondary | ICD-10-CM | POA: Diagnosis not present

## 2015-03-15 DIAGNOSIS — F5000 Anorexia nervosa, unspecified: Secondary | ICD-10-CM | POA: Diagnosis not present

## 2015-03-16 DIAGNOSIS — I10 Essential (primary) hypertension: Secondary | ICD-10-CM | POA: Diagnosis not present

## 2015-03-16 DIAGNOSIS — E785 Hyperlipidemia, unspecified: Secondary | ICD-10-CM | POA: Diagnosis not present

## 2015-03-16 DIAGNOSIS — N189 Chronic kidney disease, unspecified: Secondary | ICD-10-CM | POA: Diagnosis not present

## 2015-03-16 DIAGNOSIS — F5000 Anorexia nervosa, unspecified: Secondary | ICD-10-CM | POA: Diagnosis not present

## 2015-03-16 DIAGNOSIS — F339 Major depressive disorder, recurrent, unspecified: Secondary | ICD-10-CM | POA: Diagnosis not present

## 2015-03-16 DIAGNOSIS — J449 Chronic obstructive pulmonary disease, unspecified: Secondary | ICD-10-CM | POA: Diagnosis not present

## 2015-03-16 DIAGNOSIS — G4700 Insomnia, unspecified: Secondary | ICD-10-CM | POA: Diagnosis not present

## 2015-03-16 DIAGNOSIS — R627 Adult failure to thrive: Secondary | ICD-10-CM | POA: Diagnosis not present

## 2015-03-16 DIAGNOSIS — C61 Malignant neoplasm of prostate: Secondary | ICD-10-CM | POA: Diagnosis not present

## 2015-03-17 DIAGNOSIS — F5000 Anorexia nervosa, unspecified: Secondary | ICD-10-CM | POA: Diagnosis not present

## 2015-03-17 DIAGNOSIS — I10 Essential (primary) hypertension: Secondary | ICD-10-CM | POA: Diagnosis not present

## 2015-03-17 DIAGNOSIS — C61 Malignant neoplasm of prostate: Secondary | ICD-10-CM | POA: Diagnosis not present

## 2015-03-17 DIAGNOSIS — F339 Major depressive disorder, recurrent, unspecified: Secondary | ICD-10-CM | POA: Diagnosis not present

## 2015-03-17 DIAGNOSIS — N189 Chronic kidney disease, unspecified: Secondary | ICD-10-CM | POA: Diagnosis not present

## 2015-03-17 DIAGNOSIS — G4700 Insomnia, unspecified: Secondary | ICD-10-CM | POA: Diagnosis not present

## 2015-03-17 DIAGNOSIS — E785 Hyperlipidemia, unspecified: Secondary | ICD-10-CM | POA: Diagnosis not present

## 2015-03-17 DIAGNOSIS — J449 Chronic obstructive pulmonary disease, unspecified: Secondary | ICD-10-CM | POA: Diagnosis not present

## 2015-03-17 DIAGNOSIS — R627 Adult failure to thrive: Secondary | ICD-10-CM | POA: Diagnosis not present

## 2015-03-18 DIAGNOSIS — J449 Chronic obstructive pulmonary disease, unspecified: Secondary | ICD-10-CM | POA: Diagnosis not present

## 2015-03-18 DIAGNOSIS — F5000 Anorexia nervosa, unspecified: Secondary | ICD-10-CM | POA: Diagnosis not present

## 2015-03-18 DIAGNOSIS — R627 Adult failure to thrive: Secondary | ICD-10-CM | POA: Diagnosis not present

## 2015-03-18 DIAGNOSIS — G4700 Insomnia, unspecified: Secondary | ICD-10-CM | POA: Diagnosis not present

## 2015-03-18 DIAGNOSIS — C61 Malignant neoplasm of prostate: Secondary | ICD-10-CM | POA: Diagnosis not present

## 2015-03-18 DIAGNOSIS — I10 Essential (primary) hypertension: Secondary | ICD-10-CM | POA: Diagnosis not present

## 2015-03-18 DIAGNOSIS — N189 Chronic kidney disease, unspecified: Secondary | ICD-10-CM | POA: Diagnosis not present

## 2015-03-18 DIAGNOSIS — F339 Major depressive disorder, recurrent, unspecified: Secondary | ICD-10-CM | POA: Diagnosis not present

## 2015-03-18 DIAGNOSIS — E785 Hyperlipidemia, unspecified: Secondary | ICD-10-CM | POA: Diagnosis not present

## 2015-03-19 DIAGNOSIS — N189 Chronic kidney disease, unspecified: Secondary | ICD-10-CM | POA: Diagnosis not present

## 2015-03-19 DIAGNOSIS — C61 Malignant neoplasm of prostate: Secondary | ICD-10-CM | POA: Diagnosis not present

## 2015-03-19 DIAGNOSIS — E785 Hyperlipidemia, unspecified: Secondary | ICD-10-CM | POA: Diagnosis not present

## 2015-03-19 DIAGNOSIS — G4700 Insomnia, unspecified: Secondary | ICD-10-CM | POA: Diagnosis not present

## 2015-03-19 DIAGNOSIS — F5000 Anorexia nervosa, unspecified: Secondary | ICD-10-CM | POA: Diagnosis not present

## 2015-03-19 DIAGNOSIS — R627 Adult failure to thrive: Secondary | ICD-10-CM | POA: Diagnosis not present

## 2015-03-19 DIAGNOSIS — I10 Essential (primary) hypertension: Secondary | ICD-10-CM | POA: Diagnosis not present

## 2015-03-19 DIAGNOSIS — J449 Chronic obstructive pulmonary disease, unspecified: Secondary | ICD-10-CM | POA: Diagnosis not present

## 2015-03-19 DIAGNOSIS — F339 Major depressive disorder, recurrent, unspecified: Secondary | ICD-10-CM | POA: Diagnosis not present

## 2015-03-22 DIAGNOSIS — F339 Major depressive disorder, recurrent, unspecified: Secondary | ICD-10-CM | POA: Diagnosis not present

## 2015-03-22 DIAGNOSIS — J449 Chronic obstructive pulmonary disease, unspecified: Secondary | ICD-10-CM | POA: Diagnosis not present

## 2015-03-22 DIAGNOSIS — I10 Essential (primary) hypertension: Secondary | ICD-10-CM | POA: Diagnosis not present

## 2015-03-22 DIAGNOSIS — R627 Adult failure to thrive: Secondary | ICD-10-CM | POA: Diagnosis not present

## 2015-03-22 DIAGNOSIS — F5000 Anorexia nervosa, unspecified: Secondary | ICD-10-CM | POA: Diagnosis not present

## 2015-03-22 DIAGNOSIS — E785 Hyperlipidemia, unspecified: Secondary | ICD-10-CM | POA: Diagnosis not present

## 2015-03-22 DIAGNOSIS — C61 Malignant neoplasm of prostate: Secondary | ICD-10-CM | POA: Diagnosis not present

## 2015-03-22 DIAGNOSIS — G4700 Insomnia, unspecified: Secondary | ICD-10-CM | POA: Diagnosis not present

## 2015-03-22 DIAGNOSIS — N189 Chronic kidney disease, unspecified: Secondary | ICD-10-CM | POA: Diagnosis not present

## 2015-03-23 DIAGNOSIS — R627 Adult failure to thrive: Secondary | ICD-10-CM | POA: Diagnosis not present

## 2015-03-23 DIAGNOSIS — J449 Chronic obstructive pulmonary disease, unspecified: Secondary | ICD-10-CM | POA: Diagnosis not present

## 2015-03-23 DIAGNOSIS — F5000 Anorexia nervosa, unspecified: Secondary | ICD-10-CM | POA: Diagnosis not present

## 2015-03-23 DIAGNOSIS — N189 Chronic kidney disease, unspecified: Secondary | ICD-10-CM | POA: Diagnosis not present

## 2015-03-23 DIAGNOSIS — C61 Malignant neoplasm of prostate: Secondary | ICD-10-CM | POA: Diagnosis not present

## 2015-03-23 DIAGNOSIS — F339 Major depressive disorder, recurrent, unspecified: Secondary | ICD-10-CM | POA: Diagnosis not present

## 2015-03-23 DIAGNOSIS — E785 Hyperlipidemia, unspecified: Secondary | ICD-10-CM | POA: Diagnosis not present

## 2015-03-23 DIAGNOSIS — G4700 Insomnia, unspecified: Secondary | ICD-10-CM | POA: Diagnosis not present

## 2015-03-23 DIAGNOSIS — I10 Essential (primary) hypertension: Secondary | ICD-10-CM | POA: Diagnosis not present

## 2015-03-24 DIAGNOSIS — I10 Essential (primary) hypertension: Secondary | ICD-10-CM | POA: Diagnosis not present

## 2015-03-24 DIAGNOSIS — E785 Hyperlipidemia, unspecified: Secondary | ICD-10-CM | POA: Diagnosis not present

## 2015-03-24 DIAGNOSIS — F5000 Anorexia nervosa, unspecified: Secondary | ICD-10-CM | POA: Diagnosis not present

## 2015-03-24 DIAGNOSIS — N189 Chronic kidney disease, unspecified: Secondary | ICD-10-CM | POA: Diagnosis not present

## 2015-03-24 DIAGNOSIS — R627 Adult failure to thrive: Secondary | ICD-10-CM | POA: Diagnosis not present

## 2015-03-24 DIAGNOSIS — G4700 Insomnia, unspecified: Secondary | ICD-10-CM | POA: Diagnosis not present

## 2015-03-24 DIAGNOSIS — F339 Major depressive disorder, recurrent, unspecified: Secondary | ICD-10-CM | POA: Diagnosis not present

## 2015-03-24 DIAGNOSIS — C61 Malignant neoplasm of prostate: Secondary | ICD-10-CM | POA: Diagnosis not present

## 2015-03-24 DIAGNOSIS — J449 Chronic obstructive pulmonary disease, unspecified: Secondary | ICD-10-CM | POA: Diagnosis not present

## 2015-03-25 DIAGNOSIS — G4700 Insomnia, unspecified: Secondary | ICD-10-CM | POA: Diagnosis not present

## 2015-03-25 DIAGNOSIS — R627 Adult failure to thrive: Secondary | ICD-10-CM | POA: Diagnosis not present

## 2015-03-25 DIAGNOSIS — J449 Chronic obstructive pulmonary disease, unspecified: Secondary | ICD-10-CM | POA: Diagnosis not present

## 2015-03-25 DIAGNOSIS — F5000 Anorexia nervosa, unspecified: Secondary | ICD-10-CM | POA: Diagnosis not present

## 2015-03-25 DIAGNOSIS — F339 Major depressive disorder, recurrent, unspecified: Secondary | ICD-10-CM | POA: Diagnosis not present

## 2015-03-25 DIAGNOSIS — I10 Essential (primary) hypertension: Secondary | ICD-10-CM | POA: Diagnosis not present

## 2015-03-25 DIAGNOSIS — E785 Hyperlipidemia, unspecified: Secondary | ICD-10-CM | POA: Diagnosis not present

## 2015-03-25 DIAGNOSIS — N189 Chronic kidney disease, unspecified: Secondary | ICD-10-CM | POA: Diagnosis not present

## 2015-03-25 DIAGNOSIS — C61 Malignant neoplasm of prostate: Secondary | ICD-10-CM | POA: Diagnosis not present

## 2015-03-26 DIAGNOSIS — N189 Chronic kidney disease, unspecified: Secondary | ICD-10-CM | POA: Diagnosis not present

## 2015-03-26 DIAGNOSIS — F339 Major depressive disorder, recurrent, unspecified: Secondary | ICD-10-CM | POA: Diagnosis not present

## 2015-03-26 DIAGNOSIS — G4700 Insomnia, unspecified: Secondary | ICD-10-CM | POA: Diagnosis not present

## 2015-03-26 DIAGNOSIS — R627 Adult failure to thrive: Secondary | ICD-10-CM | POA: Diagnosis not present

## 2015-03-26 DIAGNOSIS — I10 Essential (primary) hypertension: Secondary | ICD-10-CM | POA: Diagnosis not present

## 2015-03-26 DIAGNOSIS — C61 Malignant neoplasm of prostate: Secondary | ICD-10-CM | POA: Diagnosis not present

## 2015-03-26 DIAGNOSIS — E785 Hyperlipidemia, unspecified: Secondary | ICD-10-CM | POA: Diagnosis not present

## 2015-03-26 DIAGNOSIS — F5000 Anorexia nervosa, unspecified: Secondary | ICD-10-CM | POA: Diagnosis not present

## 2015-03-26 DIAGNOSIS — J449 Chronic obstructive pulmonary disease, unspecified: Secondary | ICD-10-CM | POA: Diagnosis not present

## 2015-03-29 DIAGNOSIS — F339 Major depressive disorder, recurrent, unspecified: Secondary | ICD-10-CM | POA: Diagnosis not present

## 2015-03-29 DIAGNOSIS — J449 Chronic obstructive pulmonary disease, unspecified: Secondary | ICD-10-CM | POA: Diagnosis not present

## 2015-03-29 DIAGNOSIS — E785 Hyperlipidemia, unspecified: Secondary | ICD-10-CM | POA: Diagnosis not present

## 2015-03-29 DIAGNOSIS — C61 Malignant neoplasm of prostate: Secondary | ICD-10-CM | POA: Diagnosis not present

## 2015-03-29 DIAGNOSIS — N189 Chronic kidney disease, unspecified: Secondary | ICD-10-CM | POA: Diagnosis not present

## 2015-03-29 DIAGNOSIS — G4700 Insomnia, unspecified: Secondary | ICD-10-CM | POA: Diagnosis not present

## 2015-03-29 DIAGNOSIS — I10 Essential (primary) hypertension: Secondary | ICD-10-CM | POA: Diagnosis not present

## 2015-03-29 DIAGNOSIS — R627 Adult failure to thrive: Secondary | ICD-10-CM | POA: Diagnosis not present

## 2015-03-29 DIAGNOSIS — F5000 Anorexia nervosa, unspecified: Secondary | ICD-10-CM | POA: Diagnosis not present

## 2015-03-30 DIAGNOSIS — C61 Malignant neoplasm of prostate: Secondary | ICD-10-CM | POA: Diagnosis not present

## 2015-03-30 DIAGNOSIS — I10 Essential (primary) hypertension: Secondary | ICD-10-CM | POA: Diagnosis not present

## 2015-03-30 DIAGNOSIS — R627 Adult failure to thrive: Secondary | ICD-10-CM | POA: Diagnosis not present

## 2015-03-30 DIAGNOSIS — F339 Major depressive disorder, recurrent, unspecified: Secondary | ICD-10-CM | POA: Diagnosis not present

## 2015-03-30 DIAGNOSIS — F5000 Anorexia nervosa, unspecified: Secondary | ICD-10-CM | POA: Diagnosis not present

## 2015-03-30 DIAGNOSIS — J449 Chronic obstructive pulmonary disease, unspecified: Secondary | ICD-10-CM | POA: Diagnosis not present

## 2015-03-30 DIAGNOSIS — N189 Chronic kidney disease, unspecified: Secondary | ICD-10-CM | POA: Diagnosis not present

## 2015-03-30 DIAGNOSIS — G4700 Insomnia, unspecified: Secondary | ICD-10-CM | POA: Diagnosis not present

## 2015-03-30 DIAGNOSIS — E785 Hyperlipidemia, unspecified: Secondary | ICD-10-CM | POA: Diagnosis not present

## 2015-03-31 DIAGNOSIS — I10 Essential (primary) hypertension: Secondary | ICD-10-CM | POA: Diagnosis not present

## 2015-03-31 DIAGNOSIS — E785 Hyperlipidemia, unspecified: Secondary | ICD-10-CM | POA: Diagnosis not present

## 2015-03-31 DIAGNOSIS — F5000 Anorexia nervosa, unspecified: Secondary | ICD-10-CM | POA: Diagnosis not present

## 2015-03-31 DIAGNOSIS — F339 Major depressive disorder, recurrent, unspecified: Secondary | ICD-10-CM | POA: Diagnosis not present

## 2015-03-31 DIAGNOSIS — J449 Chronic obstructive pulmonary disease, unspecified: Secondary | ICD-10-CM | POA: Diagnosis not present

## 2015-03-31 DIAGNOSIS — G4700 Insomnia, unspecified: Secondary | ICD-10-CM | POA: Diagnosis not present

## 2015-03-31 DIAGNOSIS — R627 Adult failure to thrive: Secondary | ICD-10-CM | POA: Diagnosis not present

## 2015-03-31 DIAGNOSIS — N189 Chronic kidney disease, unspecified: Secondary | ICD-10-CM | POA: Diagnosis not present

## 2015-03-31 DIAGNOSIS — C61 Malignant neoplasm of prostate: Secondary | ICD-10-CM | POA: Diagnosis not present

## 2015-04-01 DIAGNOSIS — J449 Chronic obstructive pulmonary disease, unspecified: Secondary | ICD-10-CM | POA: Diagnosis not present

## 2015-04-01 DIAGNOSIS — I10 Essential (primary) hypertension: Secondary | ICD-10-CM | POA: Diagnosis not present

## 2015-04-01 DIAGNOSIS — Z9181 History of falling: Secondary | ICD-10-CM | POA: Diagnosis not present

## 2015-04-01 DIAGNOSIS — R269 Unspecified abnormalities of gait and mobility: Secondary | ICD-10-CM | POA: Diagnosis not present

## 2015-04-01 DIAGNOSIS — I69398 Other sequelae of cerebral infarction: Secondary | ICD-10-CM | POA: Diagnosis not present

## 2015-04-01 DIAGNOSIS — C61 Malignant neoplasm of prostate: Secondary | ICD-10-CM | POA: Diagnosis not present

## 2015-04-01 DIAGNOSIS — F5000 Anorexia nervosa, unspecified: Secondary | ICD-10-CM | POA: Diagnosis not present

## 2015-04-01 DIAGNOSIS — E785 Hyperlipidemia, unspecified: Secondary | ICD-10-CM | POA: Diagnosis not present

## 2015-04-01 DIAGNOSIS — R627 Adult failure to thrive: Secondary | ICD-10-CM | POA: Diagnosis not present

## 2015-04-01 DIAGNOSIS — G4700 Insomnia, unspecified: Secondary | ICD-10-CM | POA: Diagnosis not present

## 2015-04-01 DIAGNOSIS — F339 Major depressive disorder, recurrent, unspecified: Secondary | ICD-10-CM | POA: Diagnosis not present

## 2015-04-01 DIAGNOSIS — E440 Moderate protein-calorie malnutrition: Secondary | ICD-10-CM | POA: Diagnosis not present

## 2015-04-01 DIAGNOSIS — N189 Chronic kidney disease, unspecified: Secondary | ICD-10-CM | POA: Diagnosis not present

## 2015-04-02 DIAGNOSIS — R269 Unspecified abnormalities of gait and mobility: Secondary | ICD-10-CM | POA: Diagnosis not present

## 2015-04-02 DIAGNOSIS — R609 Edema, unspecified: Secondary | ICD-10-CM | POA: Diagnosis not present

## 2015-04-02 DIAGNOSIS — J449 Chronic obstructive pulmonary disease, unspecified: Secondary | ICD-10-CM | POA: Diagnosis not present

## 2015-04-02 DIAGNOSIS — I69398 Other sequelae of cerebral infarction: Secondary | ICD-10-CM | POA: Diagnosis not present

## 2015-04-02 DIAGNOSIS — D638 Anemia in other chronic diseases classified elsewhere: Secondary | ICD-10-CM | POA: Diagnosis not present

## 2015-04-02 DIAGNOSIS — E785 Hyperlipidemia, unspecified: Secondary | ICD-10-CM | POA: Diagnosis not present

## 2015-04-02 DIAGNOSIS — N189 Chronic kidney disease, unspecified: Secondary | ICD-10-CM | POA: Diagnosis not present

## 2015-04-02 DIAGNOSIS — I10 Essential (primary) hypertension: Secondary | ICD-10-CM | POA: Diagnosis not present

## 2015-04-02 DIAGNOSIS — E440 Moderate protein-calorie malnutrition: Secondary | ICD-10-CM | POA: Diagnosis not present

## 2015-04-02 DIAGNOSIS — L8942 Pressure ulcer of contiguous site of back, buttock and hip, stage 2: Secondary | ICD-10-CM | POA: Diagnosis not present

## 2015-04-02 DIAGNOSIS — F5000 Anorexia nervosa, unspecified: Secondary | ICD-10-CM | POA: Diagnosis not present

## 2015-04-02 DIAGNOSIS — F339 Major depressive disorder, recurrent, unspecified: Secondary | ICD-10-CM | POA: Diagnosis not present

## 2015-04-02 DIAGNOSIS — R627 Adult failure to thrive: Secondary | ICD-10-CM | POA: Diagnosis not present

## 2015-04-02 DIAGNOSIS — R531 Weakness: Secondary | ICD-10-CM | POA: Diagnosis not present

## 2015-04-02 DIAGNOSIS — G4700 Insomnia, unspecified: Secondary | ICD-10-CM | POA: Diagnosis not present

## 2015-04-02 DIAGNOSIS — C61 Malignant neoplasm of prostate: Secondary | ICD-10-CM | POA: Diagnosis not present

## 2015-04-02 DIAGNOSIS — Z9181 History of falling: Secondary | ICD-10-CM | POA: Diagnosis not present

## 2015-04-05 DIAGNOSIS — I10 Essential (primary) hypertension: Secondary | ICD-10-CM | POA: Diagnosis not present

## 2015-04-05 DIAGNOSIS — C61 Malignant neoplasm of prostate: Secondary | ICD-10-CM | POA: Diagnosis not present

## 2015-04-05 DIAGNOSIS — E440 Moderate protein-calorie malnutrition: Secondary | ICD-10-CM | POA: Diagnosis not present

## 2015-04-05 DIAGNOSIS — R269 Unspecified abnormalities of gait and mobility: Secondary | ICD-10-CM | POA: Diagnosis not present

## 2015-04-05 DIAGNOSIS — G4700 Insomnia, unspecified: Secondary | ICD-10-CM | POA: Diagnosis not present

## 2015-04-05 DIAGNOSIS — N189 Chronic kidney disease, unspecified: Secondary | ICD-10-CM | POA: Diagnosis not present

## 2015-04-05 DIAGNOSIS — F5000 Anorexia nervosa, unspecified: Secondary | ICD-10-CM | POA: Diagnosis not present

## 2015-04-05 DIAGNOSIS — J449 Chronic obstructive pulmonary disease, unspecified: Secondary | ICD-10-CM | POA: Diagnosis not present

## 2015-04-05 DIAGNOSIS — R627 Adult failure to thrive: Secondary | ICD-10-CM | POA: Diagnosis not present

## 2015-04-05 DIAGNOSIS — F339 Major depressive disorder, recurrent, unspecified: Secondary | ICD-10-CM | POA: Diagnosis not present

## 2015-04-05 DIAGNOSIS — E785 Hyperlipidemia, unspecified: Secondary | ICD-10-CM | POA: Diagnosis not present

## 2015-04-05 DIAGNOSIS — Z9181 History of falling: Secondary | ICD-10-CM | POA: Diagnosis not present

## 2015-04-05 DIAGNOSIS — I69398 Other sequelae of cerebral infarction: Secondary | ICD-10-CM | POA: Diagnosis not present

## 2015-04-06 DIAGNOSIS — Z9181 History of falling: Secondary | ICD-10-CM | POA: Diagnosis not present

## 2015-04-06 DIAGNOSIS — I69398 Other sequelae of cerebral infarction: Secondary | ICD-10-CM | POA: Diagnosis not present

## 2015-04-06 DIAGNOSIS — E785 Hyperlipidemia, unspecified: Secondary | ICD-10-CM | POA: Diagnosis not present

## 2015-04-06 DIAGNOSIS — E440 Moderate protein-calorie malnutrition: Secondary | ICD-10-CM | POA: Diagnosis not present

## 2015-04-06 DIAGNOSIS — N189 Chronic kidney disease, unspecified: Secondary | ICD-10-CM | POA: Diagnosis not present

## 2015-04-06 DIAGNOSIS — F5000 Anorexia nervosa, unspecified: Secondary | ICD-10-CM | POA: Diagnosis not present

## 2015-04-06 DIAGNOSIS — I10 Essential (primary) hypertension: Secondary | ICD-10-CM | POA: Diagnosis not present

## 2015-04-06 DIAGNOSIS — R269 Unspecified abnormalities of gait and mobility: Secondary | ICD-10-CM | POA: Diagnosis not present

## 2015-04-06 DIAGNOSIS — F339 Major depressive disorder, recurrent, unspecified: Secondary | ICD-10-CM | POA: Diagnosis not present

## 2015-04-06 DIAGNOSIS — J449 Chronic obstructive pulmonary disease, unspecified: Secondary | ICD-10-CM | POA: Diagnosis not present

## 2015-04-06 DIAGNOSIS — C61 Malignant neoplasm of prostate: Secondary | ICD-10-CM | POA: Diagnosis not present

## 2015-04-06 DIAGNOSIS — G4700 Insomnia, unspecified: Secondary | ICD-10-CM | POA: Diagnosis not present

## 2015-04-06 DIAGNOSIS — R627 Adult failure to thrive: Secondary | ICD-10-CM | POA: Diagnosis not present

## 2015-04-07 DIAGNOSIS — C61 Malignant neoplasm of prostate: Secondary | ICD-10-CM | POA: Diagnosis not present

## 2015-04-07 DIAGNOSIS — G4700 Insomnia, unspecified: Secondary | ICD-10-CM | POA: Diagnosis not present

## 2015-04-07 DIAGNOSIS — M79674 Pain in right toe(s): Secondary | ICD-10-CM | POA: Diagnosis not present

## 2015-04-07 DIAGNOSIS — J449 Chronic obstructive pulmonary disease, unspecified: Secondary | ICD-10-CM | POA: Diagnosis not present

## 2015-04-07 DIAGNOSIS — R269 Unspecified abnormalities of gait and mobility: Secondary | ICD-10-CM | POA: Diagnosis not present

## 2015-04-07 DIAGNOSIS — R627 Adult failure to thrive: Secondary | ICD-10-CM | POA: Diagnosis not present

## 2015-04-07 DIAGNOSIS — E785 Hyperlipidemia, unspecified: Secondary | ICD-10-CM | POA: Diagnosis not present

## 2015-04-07 DIAGNOSIS — F339 Major depressive disorder, recurrent, unspecified: Secondary | ICD-10-CM | POA: Diagnosis not present

## 2015-04-07 DIAGNOSIS — B351 Tinea unguium: Secondary | ICD-10-CM | POA: Diagnosis not present

## 2015-04-07 DIAGNOSIS — I10 Essential (primary) hypertension: Secondary | ICD-10-CM | POA: Diagnosis not present

## 2015-04-07 DIAGNOSIS — Z9181 History of falling: Secondary | ICD-10-CM | POA: Diagnosis not present

## 2015-04-07 DIAGNOSIS — M79675 Pain in left toe(s): Secondary | ICD-10-CM | POA: Diagnosis not present

## 2015-04-07 DIAGNOSIS — I69398 Other sequelae of cerebral infarction: Secondary | ICD-10-CM | POA: Diagnosis not present

## 2015-04-07 DIAGNOSIS — F5000 Anorexia nervosa, unspecified: Secondary | ICD-10-CM | POA: Diagnosis not present

## 2015-04-07 DIAGNOSIS — E440 Moderate protein-calorie malnutrition: Secondary | ICD-10-CM | POA: Diagnosis not present

## 2015-04-07 DIAGNOSIS — N189 Chronic kidney disease, unspecified: Secondary | ICD-10-CM | POA: Diagnosis not present

## 2015-04-08 DIAGNOSIS — I69398 Other sequelae of cerebral infarction: Secondary | ICD-10-CM | POA: Diagnosis not present

## 2015-04-08 DIAGNOSIS — C61 Malignant neoplasm of prostate: Secondary | ICD-10-CM | POA: Diagnosis not present

## 2015-04-08 DIAGNOSIS — F5000 Anorexia nervosa, unspecified: Secondary | ICD-10-CM | POA: Diagnosis not present

## 2015-04-08 DIAGNOSIS — G4700 Insomnia, unspecified: Secondary | ICD-10-CM | POA: Diagnosis not present

## 2015-04-08 DIAGNOSIS — I10 Essential (primary) hypertension: Secondary | ICD-10-CM | POA: Diagnosis not present

## 2015-04-08 DIAGNOSIS — F339 Major depressive disorder, recurrent, unspecified: Secondary | ICD-10-CM | POA: Diagnosis not present

## 2015-04-08 DIAGNOSIS — R269 Unspecified abnormalities of gait and mobility: Secondary | ICD-10-CM | POA: Diagnosis not present

## 2015-04-08 DIAGNOSIS — N189 Chronic kidney disease, unspecified: Secondary | ICD-10-CM | POA: Diagnosis not present

## 2015-04-08 DIAGNOSIS — J449 Chronic obstructive pulmonary disease, unspecified: Secondary | ICD-10-CM | POA: Diagnosis not present

## 2015-04-08 DIAGNOSIS — Z9181 History of falling: Secondary | ICD-10-CM | POA: Diagnosis not present

## 2015-04-08 DIAGNOSIS — E785 Hyperlipidemia, unspecified: Secondary | ICD-10-CM | POA: Diagnosis not present

## 2015-04-08 DIAGNOSIS — E440 Moderate protein-calorie malnutrition: Secondary | ICD-10-CM | POA: Diagnosis not present

## 2015-04-08 DIAGNOSIS — R627 Adult failure to thrive: Secondary | ICD-10-CM | POA: Diagnosis not present

## 2015-05-24 DIAGNOSIS — I509 Heart failure, unspecified: Secondary | ICD-10-CM | POA: Diagnosis not present

## 2015-05-25 DIAGNOSIS — I10 Essential (primary) hypertension: Secondary | ICD-10-CM | POA: Diagnosis not present

## 2015-05-25 DIAGNOSIS — E559 Vitamin D deficiency, unspecified: Secondary | ICD-10-CM | POA: Diagnosis not present

## 2015-05-25 DIAGNOSIS — D519 Vitamin B12 deficiency anemia, unspecified: Secondary | ICD-10-CM | POA: Diagnosis not present

## 2015-05-25 DIAGNOSIS — N390 Urinary tract infection, site not specified: Secondary | ICD-10-CM | POA: Diagnosis not present

## 2015-05-27 DIAGNOSIS — I10 Essential (primary) hypertension: Secondary | ICD-10-CM | POA: Diagnosis not present

## 2015-05-31 DIAGNOSIS — I2510 Atherosclerotic heart disease of native coronary artery without angina pectoris: Secondary | ICD-10-CM | POA: Diagnosis not present

## 2015-05-31 DIAGNOSIS — I5033 Acute on chronic diastolic (congestive) heart failure: Secondary | ICD-10-CM | POA: Diagnosis not present

## 2015-05-31 DIAGNOSIS — J449 Chronic obstructive pulmonary disease, unspecified: Secondary | ICD-10-CM | POA: Diagnosis not present

## 2015-05-31 DIAGNOSIS — E559 Vitamin D deficiency, unspecified: Secondary | ICD-10-CM | POA: Diagnosis not present

## 2015-06-03 DIAGNOSIS — I10 Essential (primary) hypertension: Secondary | ICD-10-CM | POA: Diagnosis not present

## 2015-06-08 DIAGNOSIS — R0602 Shortness of breath: Secondary | ICD-10-CM | POA: Diagnosis not present

## 2015-06-08 DIAGNOSIS — A047 Enterocolitis due to Clostridium difficile: Secondary | ICD-10-CM | POA: Diagnosis not present

## 2015-06-23 DIAGNOSIS — N19 Unspecified kidney failure: Secondary | ICD-10-CM | POA: Diagnosis not present

## 2015-06-23 DIAGNOSIS — I509 Heart failure, unspecified: Secondary | ICD-10-CM | POA: Diagnosis not present

## 2015-06-23 DIAGNOSIS — G9349 Other encephalopathy: Secondary | ICD-10-CM | POA: Diagnosis not present

## 2015-06-30 DEATH — deceased
